# Patient Record
Sex: Male | Born: 1955
Health system: Southern US, Community
[De-identification: ages and names within clinical notes are randomized; demographics above are authoritative.]

## PROBLEM LIST (undated history)

## (undated) DIAGNOSIS — I10 Essential (primary) hypertension: Secondary | ICD-10-CM

## (undated) DIAGNOSIS — Z87442 Personal history of urinary calculi: Secondary | ICD-10-CM

## (undated) DIAGNOSIS — M1990 Unspecified osteoarthritis, unspecified site: Secondary | ICD-10-CM

## (undated) DIAGNOSIS — K219 Gastro-esophageal reflux disease without esophagitis: Secondary | ICD-10-CM

## (undated) DIAGNOSIS — G4700 Insomnia, unspecified: Secondary | ICD-10-CM

## (undated) HISTORY — PX: LASIK: SHX215

## (undated) HISTORY — PX: APPENDECTOMY: SHX54

## (undated) HISTORY — PX: OTHER SURGICAL HISTORY: SHX169

## (undated) HISTORY — PX: EYE SURGERY: SHX253

## (undated) HISTORY — PX: WISDOM TOOTH EXTRACTION: SHX21

---

## 2004-07-10 ENCOUNTER — Emergency Department: Payer: Self-pay | Admitting: Emergency Medicine

## 2004-07-10 ENCOUNTER — Other Ambulatory Visit: Payer: Self-pay

## 2004-07-16 ENCOUNTER — Ambulatory Visit: Payer: Self-pay | Admitting: Unknown Physician Specialty

## 2005-11-14 ENCOUNTER — Ambulatory Visit: Payer: Self-pay | Admitting: Unknown Physician Specialty

## 2007-03-11 ENCOUNTER — Ambulatory Visit: Payer: Self-pay | Admitting: Unknown Physician Specialty

## 2007-03-13 ENCOUNTER — Emergency Department: Payer: Self-pay | Admitting: Emergency Medicine

## 2008-03-22 ENCOUNTER — Ambulatory Visit: Payer: Self-pay | Admitting: Unknown Physician Specialty

## 2009-08-23 ENCOUNTER — Ambulatory Visit: Payer: Self-pay | Admitting: General Practice

## 2011-06-16 ENCOUNTER — Emergency Department: Payer: Self-pay | Admitting: Emergency Medicine

## 2012-05-27 ENCOUNTER — Emergency Department: Payer: Self-pay | Admitting: Emergency Medicine

## 2013-10-08 ENCOUNTER — Ambulatory Visit: Payer: Self-pay | Admitting: Unknown Physician Specialty

## 2013-10-08 HISTORY — PX: COLONOSCOPY: SHX174

## 2014-08-10 ENCOUNTER — Emergency Department: Payer: Self-pay | Admitting: Emergency Medicine

## 2014-08-10 LAB — BASIC METABOLIC PANEL
Anion Gap: 7 (ref 7–16)
BUN: 25 mg/dL — ABNORMAL HIGH (ref 7–18)
CALCIUM: 9 mg/dL (ref 8.5–10.1)
CHLORIDE: 104 mmol/L (ref 98–107)
Co2: 29 mmol/L (ref 21–32)
Creatinine: 1.25 mg/dL (ref 0.60–1.30)
EGFR (African American): >60
EGFR (Non-African Amer.): >60
GLUCOSE: 126 mg/dL — AB (ref 65–99)
OSMOLALITY: 285 (ref 275–301)
Potassium: 3.9 mmol/L (ref 3.5–5.1)
Sodium: 140 mmol/L (ref 136–145)

## 2014-08-10 LAB — CBC
HCT: 44.8 % (ref 40.0–52.0)
HGB: 15 g/dL (ref 13.0–18.0)
MCH: 31.2 pg (ref 26.0–34.0)
MCHC: 33.5 g/dL (ref 32.0–36.0)
MCV: 93 fL (ref 80–100)
PLATELETS: 230 10*3/uL (ref 150–440)
RBC: 4.81 10*6/uL (ref 4.40–5.90)
RDW: 12.5 % (ref 11.5–14.5)
WBC: 6.2 10*3/uL (ref 3.8–10.6)

## 2014-08-10 LAB — URINALYSIS, COMPLETE
BACTERIA: NONE SEEN
BLOOD: NEGATIVE
Bilirubin,UR: NEGATIVE
Glucose,UR: NEGATIVE mg/dL (ref 0–75)
Hyaline Cast: 6
Ketone: NEGATIVE
LEUKOCYTE ESTERASE: NEGATIVE
NITRITE: NEGATIVE
PH: 5 (ref 4.5–8.0)
Protein: NEGATIVE
SPECIFIC GRAVITY: 1.029 (ref 1.003–1.030)
Squamous Epithelial: <1
WBC UR: 1 /HPF (ref 0–5)

## 2015-09-14 DIAGNOSIS — I10 Essential (primary) hypertension: Secondary | ICD-10-CM | POA: Diagnosis not present

## 2015-09-14 DIAGNOSIS — Z125 Encounter for screening for malignant neoplasm of prostate: Secondary | ICD-10-CM | POA: Diagnosis not present

## 2015-09-14 DIAGNOSIS — N200 Calculus of kidney: Secondary | ICD-10-CM | POA: Diagnosis not present

## 2015-10-02 DIAGNOSIS — I10 Essential (primary) hypertension: Secondary | ICD-10-CM | POA: Insufficient documentation

## 2015-10-02 DIAGNOSIS — K219 Gastro-esophageal reflux disease without esophagitis: Secondary | ICD-10-CM | POA: Insufficient documentation

## 2015-10-02 DIAGNOSIS — N200 Calculus of kidney: Secondary | ICD-10-CM | POA: Diagnosis not present

## 2015-10-02 DIAGNOSIS — Z0000 Encounter for general adult medical examination without abnormal findings: Secondary | ICD-10-CM | POA: Diagnosis not present

## 2015-10-17 ENCOUNTER — Ambulatory Visit: Payer: Self-pay | Admitting: Physician Assistant

## 2015-10-19 ENCOUNTER — Ambulatory Visit: Payer: Self-pay

## 2015-10-30 ENCOUNTER — Other Ambulatory Visit: Payer: Self-pay

## 2015-10-30 NOTE — Progress Notes (Unsigned)
Patient in office today for Zoster vac injection only  VIS given and consent authorization given. Zoster shot given left deltoid 0.60ml sq.  Patient waited no adverse reaction

## 2016-01-04 DIAGNOSIS — H524 Presbyopia: Secondary | ICD-10-CM | POA: Diagnosis not present

## 2016-03-26 DIAGNOSIS — K219 Gastro-esophageal reflux disease without esophagitis: Secondary | ICD-10-CM | POA: Diagnosis not present

## 2016-03-26 DIAGNOSIS — N200 Calculus of kidney: Secondary | ICD-10-CM | POA: Diagnosis not present

## 2016-03-26 DIAGNOSIS — I10 Essential (primary) hypertension: Secondary | ICD-10-CM | POA: Diagnosis not present

## 2016-04-09 DIAGNOSIS — Z125 Encounter for screening for malignant neoplasm of prostate: Secondary | ICD-10-CM | POA: Diagnosis not present

## 2016-04-09 DIAGNOSIS — I10 Essential (primary) hypertension: Secondary | ICD-10-CM | POA: Diagnosis not present

## 2016-04-09 DIAGNOSIS — N200 Calculus of kidney: Secondary | ICD-10-CM | POA: Diagnosis not present

## 2016-04-09 DIAGNOSIS — K219 Gastro-esophageal reflux disease without esophagitis: Secondary | ICD-10-CM | POA: Diagnosis not present

## 2016-08-19 DIAGNOSIS — H903 Sensorineural hearing loss, bilateral: Secondary | ICD-10-CM | POA: Diagnosis not present

## 2016-09-13 DIAGNOSIS — F4322 Adjustment disorder with anxiety: Secondary | ICD-10-CM | POA: Diagnosis not present

## 2016-10-01 ENCOUNTER — Ambulatory Visit: Payer: Self-pay | Admitting: Physician Assistant

## 2016-10-01 VITALS — BP 140/90 | HR 67 | Temp 97.7°F

## 2016-10-01 DIAGNOSIS — J0110 Acute frontal sinusitis, unspecified: Secondary | ICD-10-CM

## 2016-10-01 MED ORDER — PREDNISONE 10 MG PO TABS: ORAL_TABLET | ORAL | 0 refills | Status: DC

## 2016-10-01 MED ORDER — AMOXICILLIN-POT CLAVULANATE 875-125 MG PO TABS: 1.0000 | ORAL_TABLET | Freq: Two times a day (BID) | ORAL | 0 refills | Status: DC

## 2016-10-01 NOTE — Progress Notes (Signed)
S:  Head congestion, yellow blood mucus at times, h/a and sinus pressure on the right for 2 weeks.  Continues to use flonase and saline nose spray with minimal relief.  Increased pressure on right frontal sinus last several days.    O:  TMS dull bilat, min fluid.  Nose mild boggy.  Right max and frontal sinus with pressure with percussion.  Neck supple without aden.   Lungs Clear bilat.  Heart RRR.    A: Right frontal and Maxillary sinusitis  P:  Prednisone taper 60 mg and Augmentin 875mg  bid

## 2016-10-07 DIAGNOSIS — Z125 Encounter for screening for malignant neoplasm of prostate: Secondary | ICD-10-CM | POA: Diagnosis not present

## 2016-10-07 DIAGNOSIS — I10 Essential (primary) hypertension: Secondary | ICD-10-CM | POA: Diagnosis not present

## 2016-10-07 DIAGNOSIS — K219 Gastro-esophageal reflux disease without esophagitis: Secondary | ICD-10-CM | POA: Diagnosis not present

## 2016-10-07 DIAGNOSIS — N200 Calculus of kidney: Secondary | ICD-10-CM | POA: Diagnosis not present

## 2016-10-08 DIAGNOSIS — F4322 Adjustment disorder with anxiety: Secondary | ICD-10-CM | POA: Diagnosis not present

## 2016-10-11 DIAGNOSIS — N200 Calculus of kidney: Secondary | ICD-10-CM | POA: Diagnosis not present

## 2016-10-11 DIAGNOSIS — Z0001 Encounter for general adult medical examination with abnormal findings: Secondary | ICD-10-CM | POA: Diagnosis not present

## 2016-10-11 DIAGNOSIS — K219 Gastro-esophageal reflux disease without esophagitis: Secondary | ICD-10-CM | POA: Diagnosis not present

## 2016-10-11 DIAGNOSIS — I10 Essential (primary) hypertension: Secondary | ICD-10-CM | POA: Diagnosis not present

## 2016-10-11 DIAGNOSIS — F5104 Psychophysiologic insomnia: Secondary | ICD-10-CM | POA: Insufficient documentation

## 2016-10-23 ENCOUNTER — Ambulatory Visit: Payer: Self-pay | Admitting: Physician Assistant

## 2016-10-23 VITALS — BP 130/90 | HR 76 | Temp 98.3°F

## 2016-10-23 DIAGNOSIS — J069 Acute upper respiratory infection, unspecified: Secondary | ICD-10-CM

## 2016-10-23 DIAGNOSIS — R059 Cough, unspecified: Secondary | ICD-10-CM

## 2016-10-23 DIAGNOSIS — R05 Cough: Secondary | ICD-10-CM

## 2016-10-23 DIAGNOSIS — N200 Calculus of kidney: Secondary | ICD-10-CM | POA: Insufficient documentation

## 2016-10-23 LAB — POCT INFLUENZA A/B
Influenza A, POC: NEGATIVE
Influenza B, POC: NEGATIVE

## 2016-10-23 MED ORDER — LEVOFLOXACIN 500 MG PO TABS: 500.0000 mg | ORAL_TABLET | Freq: Every day | ORAL | 0 refills | Status: DC

## 2016-10-23 NOTE — Progress Notes (Signed)
S: C/o runny nose and congestion with dry cough for 3-4 days, + fever, chills, denies cp/sob, v/d; mucus was yellow this am , had cold sx about a month ago and was on augmentin  O: PE: vitals wnl, nad,  perrl eomi, normocephalic, tms dull, nasal mucosa red and swollen, throat injected, neck supple no lymph, lungs c t a, cv rrr, neuro intact, flu swab neg  A:  Acute flu like illness   P: drink fluids, continue regular meds , use otc meds of choice, return if not improving in 5 days, return earlier if worsening , levaquin 500mg  qd x 7d, if worsening with fever/chills/bodyaches, use tamiflu

## 2017-01-22 DIAGNOSIS — H524 Presbyopia: Secondary | ICD-10-CM | POA: Diagnosis not present

## 2017-04-04 DIAGNOSIS — I10 Essential (primary) hypertension: Secondary | ICD-10-CM | POA: Diagnosis not present

## 2017-04-15 DIAGNOSIS — K219 Gastro-esophageal reflux disease without esophagitis: Secondary | ICD-10-CM | POA: Diagnosis not present

## 2017-04-15 DIAGNOSIS — I10 Essential (primary) hypertension: Secondary | ICD-10-CM | POA: Diagnosis not present

## 2017-04-15 DIAGNOSIS — F5104 Psychophysiologic insomnia: Secondary | ICD-10-CM | POA: Diagnosis not present

## 2017-04-15 DIAGNOSIS — Z125 Encounter for screening for malignant neoplasm of prostate: Secondary | ICD-10-CM | POA: Diagnosis not present

## 2017-10-07 DIAGNOSIS — Z125 Encounter for screening for malignant neoplasm of prostate: Secondary | ICD-10-CM | POA: Diagnosis not present

## 2017-10-07 DIAGNOSIS — F5104 Psychophysiologic insomnia: Secondary | ICD-10-CM | POA: Diagnosis not present

## 2017-10-07 DIAGNOSIS — K219 Gastro-esophageal reflux disease without esophagitis: Secondary | ICD-10-CM | POA: Diagnosis not present

## 2017-10-07 DIAGNOSIS — I10 Essential (primary) hypertension: Secondary | ICD-10-CM | POA: Diagnosis not present

## 2017-10-14 DIAGNOSIS — F5104 Psychophysiologic insomnia: Secondary | ICD-10-CM | POA: Diagnosis not present

## 2017-10-14 DIAGNOSIS — Z0000 Encounter for general adult medical examination without abnormal findings: Secondary | ICD-10-CM | POA: Diagnosis not present

## 2017-10-14 DIAGNOSIS — I10 Essential (primary) hypertension: Secondary | ICD-10-CM | POA: Diagnosis not present

## 2017-10-14 DIAGNOSIS — K219 Gastro-esophageal reflux disease without esophagitis: Secondary | ICD-10-CM | POA: Diagnosis not present

## 2017-11-10 DIAGNOSIS — R972 Elevated prostate specific antigen [PSA]: Secondary | ICD-10-CM | POA: Diagnosis not present

## 2017-11-10 DIAGNOSIS — K219 Gastro-esophageal reflux disease without esophagitis: Secondary | ICD-10-CM | POA: Diagnosis not present

## 2018-04-07 DIAGNOSIS — K219 Gastro-esophageal reflux disease without esophagitis: Secondary | ICD-10-CM | POA: Diagnosis not present

## 2018-04-07 DIAGNOSIS — I10 Essential (primary) hypertension: Secondary | ICD-10-CM | POA: Diagnosis not present

## 2018-04-07 DIAGNOSIS — F5104 Psychophysiologic insomnia: Secondary | ICD-10-CM | POA: Diagnosis not present

## 2018-04-13 DIAGNOSIS — H524 Presbyopia: Secondary | ICD-10-CM | POA: Diagnosis not present

## 2018-04-13 DIAGNOSIS — Z125 Encounter for screening for malignant neoplasm of prostate: Secondary | ICD-10-CM | POA: Diagnosis not present

## 2018-04-13 DIAGNOSIS — F5104 Psychophysiologic insomnia: Secondary | ICD-10-CM | POA: Diagnosis not present

## 2018-04-13 DIAGNOSIS — I10 Essential (primary) hypertension: Secondary | ICD-10-CM | POA: Diagnosis not present

## 2018-04-13 DIAGNOSIS — K219 Gastro-esophageal reflux disease without esophagitis: Secondary | ICD-10-CM | POA: Diagnosis not present

## 2018-10-07 DIAGNOSIS — K219 Gastro-esophageal reflux disease without esophagitis: Secondary | ICD-10-CM | POA: Diagnosis not present

## 2018-10-07 DIAGNOSIS — I10 Essential (primary) hypertension: Secondary | ICD-10-CM | POA: Diagnosis not present

## 2018-10-07 DIAGNOSIS — Z125 Encounter for screening for malignant neoplasm of prostate: Secondary | ICD-10-CM | POA: Diagnosis not present

## 2018-10-15 DIAGNOSIS — K219 Gastro-esophageal reflux disease without esophagitis: Secondary | ICD-10-CM | POA: Diagnosis not present

## 2018-10-15 DIAGNOSIS — I10 Essential (primary) hypertension: Secondary | ICD-10-CM | POA: Diagnosis not present

## 2018-10-15 DIAGNOSIS — Z0000 Encounter for general adult medical examination without abnormal findings: Secondary | ICD-10-CM | POA: Diagnosis not present

## 2018-10-15 DIAGNOSIS — F5104 Psychophysiologic insomnia: Secondary | ICD-10-CM | POA: Diagnosis not present

## 2019-02-15 ENCOUNTER — Inpatient Hospital Stay: Admission: RE | Admit: 2019-02-15 | Payer: 59 | Source: Ambulatory Visit

## 2019-03-02 ENCOUNTER — Other Ambulatory Visit: Admission: RE | Admit: 2019-03-02 | Payer: 59 | Source: Ambulatory Visit

## 2019-03-05 ENCOUNTER — Ambulatory Visit: Admit: 2019-03-05 | Payer: 59 | Admitting: Unknown Physician Specialty

## 2019-03-05 SURGERY — COLONOSCOPY WITH PROPOFOL
Anesthesia: General

## 2019-04-01 ENCOUNTER — Other Ambulatory Visit: Admission: RE | Admit: 2019-04-01 | Payer: Self-pay | Source: Ambulatory Visit

## 2019-04-05 ENCOUNTER — Encounter
Admission: RE | Admit: 2019-04-05 | Discharge: 2019-04-05 | Disposition: A | Discharge status: Home / Self Care | Payer: PPO | Source: Ambulatory Visit | Attending: Unknown Physician Specialty | Admitting: Unknown Physician Specialty

## 2019-04-05 ENCOUNTER — Other Ambulatory Visit: Payer: Self-pay

## 2019-04-05 DIAGNOSIS — Z79899 Other long term (current) drug therapy: Secondary | ICD-10-CM | POA: Diagnosis not present

## 2019-04-05 DIAGNOSIS — G4700 Insomnia, unspecified: Secondary | ICD-10-CM | POA: Diagnosis not present

## 2019-04-05 DIAGNOSIS — I10 Essential (primary) hypertension: Secondary | ICD-10-CM | POA: Diagnosis not present

## 2019-04-05 DIAGNOSIS — Z8719 Personal history of other diseases of the digestive system: Secondary | ICD-10-CM | POA: Diagnosis not present

## 2019-04-05 DIAGNOSIS — K641 Second degree hemorrhoids: Secondary | ICD-10-CM | POA: Diagnosis not present

## 2019-04-05 DIAGNOSIS — Z1211 Encounter for screening for malignant neoplasm of colon: Secondary | ICD-10-CM | POA: Diagnosis not present

## 2019-04-05 DIAGNOSIS — Z1159 Encounter for screening for other viral diseases: Secondary | ICD-10-CM | POA: Insufficient documentation

## 2019-04-05 DIAGNOSIS — Z20828 Contact with and (suspected) exposure to other viral communicable diseases: Secondary | ICD-10-CM | POA: Diagnosis not present

## 2019-04-05 LAB — SARS CORONAVIRUS 2 (TAT 6-24 HRS): SARS Coronavirus 2: NEGATIVE

## 2019-04-06 ENCOUNTER — Encounter: Payer: Self-pay | Admitting: *Deleted

## 2019-04-07 ENCOUNTER — Ambulatory Visit
Admission: RE | Admit: 2019-04-07 | Discharge: 2019-04-07 | Disposition: A | Discharge status: Home / Self Care | Payer: PPO | Attending: Internal Medicine | Admitting: Internal Medicine

## 2019-04-07 ENCOUNTER — Encounter: Payer: Self-pay | Admitting: *Deleted

## 2019-04-07 ENCOUNTER — Ambulatory Visit: Payer: PPO | Admitting: Anesthesiology

## 2019-04-07 ENCOUNTER — Encounter
Admission: RE | Disposition: A | Discharge status: Home / Self Care | Payer: Self-pay | Source: Home / Self Care | Attending: Internal Medicine

## 2019-04-07 DIAGNOSIS — K644 Residual hemorrhoidal skin tags: Secondary | ICD-10-CM | POA: Diagnosis not present

## 2019-04-07 DIAGNOSIS — K640 First degree hemorrhoids: Secondary | ICD-10-CM | POA: Diagnosis not present

## 2019-04-07 DIAGNOSIS — G4700 Insomnia, unspecified: Secondary | ICD-10-CM | POA: Insufficient documentation

## 2019-04-07 DIAGNOSIS — Z79899 Other long term (current) drug therapy: Secondary | ICD-10-CM | POA: Insufficient documentation

## 2019-04-07 DIAGNOSIS — Z8719 Personal history of other diseases of the digestive system: Secondary | ICD-10-CM | POA: Insufficient documentation

## 2019-04-07 DIAGNOSIS — Z20828 Contact with and (suspected) exposure to other viral communicable diseases: Secondary | ICD-10-CM | POA: Insufficient documentation

## 2019-04-07 DIAGNOSIS — Z1211 Encounter for screening for malignant neoplasm of colon: Secondary | ICD-10-CM | POA: Diagnosis not present

## 2019-04-07 DIAGNOSIS — I10 Essential (primary) hypertension: Secondary | ICD-10-CM | POA: Insufficient documentation

## 2019-04-07 DIAGNOSIS — K641 Second degree hemorrhoids: Secondary | ICD-10-CM | POA: Insufficient documentation

## 2019-04-07 DIAGNOSIS — Z86010 Personal history of colonic polyps: Secondary | ICD-10-CM | POA: Diagnosis not present

## 2019-04-07 HISTORY — PX: COLONOSCOPY WITH PROPOFOL: SHX5780

## 2019-04-07 HISTORY — DX: Insomnia, unspecified: G47.00

## 2019-04-07 HISTORY — DX: Personal history of urinary calculi: Z87.442

## 2019-04-07 HISTORY — DX: Gastro-esophageal reflux disease without esophagitis: K21.9

## 2019-04-07 HISTORY — DX: Essential (primary) hypertension: I10

## 2019-04-07 SURGERY — COLONOSCOPY WITH PROPOFOL
Anesthesia: General

## 2019-04-07 MED ORDER — PROPOFOL 500 MG/50ML IV EMUL
INTRAVENOUS | Status: AC
  Filled 2019-04-07: qty 50

## 2019-04-07 MED ORDER — LIDOCAINE HCL (PF) 2 % IJ SOLN
INTRAMUSCULAR | Status: AC
  Filled 2019-04-07: qty 10

## 2019-04-07 MED ORDER — LIDOCAINE HCL (CARDIAC) PF 100 MG/5ML IV SOSY
PREFILLED_SYRINGE | INTRAVENOUS | Status: DC | PRN
  Administered 2019-04-07: 100 mg via INTRAVENOUS

## 2019-04-07 MED ORDER — PROPOFOL 10 MG/ML IV BOLUS
INTRAVENOUS | Status: DC | PRN
  Administered 2019-04-07: 30 mg via INTRAVENOUS
  Administered 2019-04-07: 50 mg via INTRAVENOUS
  Administered 2019-04-07: 30 mg via INTRAVENOUS

## 2019-04-07 MED ORDER — PROPOFOL 500 MG/50ML IV EMUL
INTRAVENOUS | Status: DC | PRN
  Administered 2019-04-07: 100 ug/kg/min via INTRAVENOUS

## 2019-04-07 MED ORDER — SODIUM CHLORIDE 0.9 % IV SOLN
INTRAVENOUS | Status: DC
  Administered 2019-04-07: 10:00:00 via INTRAVENOUS

## 2019-04-07 NOTE — Anesthesia Post-op Follow-up Note (Signed)
Anesthesia QCDR form completed.        

## 2019-04-07 NOTE — Anesthesia Preprocedure Evaluation (Signed)
Anesthesia Evaluation  Patient identified by MRN, date of birth, ID band Patient awake    Reviewed: Allergy & Precautions, H&P , NPO status , Patient's Chart, lab work & pertinent test results, reviewed documented beta blocker date and time   History of Anesthesia Complications Negative for: history of anesthetic complications  Airway Mallampati: II  TM Distance: >3 FB Neck ROM: full    Dental  (+) Implants, Dental Advidsory Given, Teeth Intact   Pulmonary neg pulmonary ROS,    Pulmonary exam normal        Cardiovascular Exercise Tolerance: Good hypertension, (-) angina(-) Past MI and (-) Cardiac Stents Normal cardiovascular exam(-) dysrhythmias (-) Valvular Problems/Murmurs     Neuro/Psych negative neurological ROS  negative psych ROS   GI/Hepatic Neg liver ROS, GERD  ,  Endo/Other  negative endocrine ROS  Renal/GU Renal disease (stones)  negative genitourinary   Musculoskeletal   Abdominal   Peds  Hematology negative hematology ROS (+)   Anesthesia Other Findings Past Medical History: No date: GERD (gastroesophageal reflux disease) No date: History of kidney stones No date: Hypertension No date: Insomnia disorder   Reproductive/Obstetrics negative OB ROS                             Anesthesia Physical Anesthesia Plan  ASA: II  Anesthesia Plan: General   Post-op Pain Management:    Induction: Intravenous  PONV Risk Score and Plan: 2 and Propofol infusion and TIVA  Airway Management Planned: Natural Airway and Nasal Cannula  Additional Equipment:   Intra-op Plan:   Post-operative Plan:   Informed Consent: I have reviewed the patients History and Physical, chart, labs and discussed the procedure including the risks, benefits and alternatives for the proposed anesthesia with the patient or authorized representative who has indicated his/her understanding and acceptance.      Dental Advisory Given  Plan Discussed with: Anesthesiologist, CRNA and Surgeon  Anesthesia Plan Comments:         Anesthesia Quick Evaluation

## 2019-04-07 NOTE — Transfer of Care (Signed)
Immediate Anesthesia Transfer of Care Note  Patient: Lee Gonzales  Procedure(s) Performed: COLONOSCOPY WITH PROPOFOL (N/A )  Patient Location: PACU  Anesthesia Type:General  Level of Consciousness: oriented  Airway & Oxygen Therapy: Patient Spontanous Breathing and Patient connected to nasal cannula oxygen  Post-op Assessment: Report given to RN and Post -op Vital signs reviewed and stable  Post vital signs: Reviewed and stable  Last Vitals:  Vitals Value Taken Time  BP    Temp    Pulse    Resp    SpO2      Last Pain:  Vitals:   04/07/19 1004  TempSrc: Tympanic  PainSc: 0-No pain         Complications: No apparent anesthesia complications

## 2019-04-07 NOTE — H&P (Signed)
Outpatient short stay form Pre-procedure 04/07/2019 8:56 AM Lee Gonzales K. Alice Reichert, M.D.  Primary Physician: Ramonita Lab, M.D.  Reason for visit:  Personal hx of adenomatous colon polyps  History of present illness:                            Patient presents for colonoscopy for a personal hx of colon polyps. The patient denies abdominal pain, abnormal weight loss or rectal bleeding.    No current facility-administered medications for this encounter.   Current Outpatient Medications:  .  fluticasone (FLONASE) 50 MCG/ACT nasal spray, , Disp: , Rfl: 12 .  levofloxacin (LEVAQUIN) 500 MG tablet, Take 1 tablet (500 mg total) by mouth daily., Disp: 7 tablet, Rfl: 0 .  lisinopril (PRINIVIL,ZESTRIL) 40 MG tablet, TAKE ONE TABLET BY MOUTH DAILY, Disp: , Rfl:  .  zolpidem (AMBIEN CR) 6.25 MG CR tablet, TAKE ONE TABLET BY MOUTH AT BEDTIME AS NEEDED FOR SLEEP, Disp: , Rfl:   No medications prior to admission.     No Known Allergies   Past Medical History:  Diagnosis Date  . GERD (gastroesophageal reflux disease)   . History of kidney stones   . Hypertension   . Insomnia disorder     Review of systems:  Otherwise negative.    Physical Exam  Gen: Alert, oriented. Appears stated age.  HEENT: [place]/AT. PERRLA. Lungs: CTA, no wheezes. CV: RR nl S1, S2. Abd: soft, benign, no masses. BS+ Ext: No edema. Pulses 2+    Planned procedures: Proceed with colonoscopy. The patient understands the nature of the planned procedure, indications, risks, alternatives and potential complications including but not limited to bleeding, infection, perforation, damage to internal organs and possible oversedation/side effects from anesthesia. The patient agrees and gives consent to proceed.  Please refer to procedure notes for findings, recommendations and patient disposition/instructions.     Lee Gonzales K. Alice Reichert, M.D. Gastroenterology 04/07/2019  8:56 AM

## 2019-04-07 NOTE — Op Note (Signed)
[facility] Gastroenterology Patient Name: Lee Gonzales Procedure Date: 04/07/2019 10:34 AM MRN: [id number] Account #: [id number] Date of Birth: 09/06/1956 Admit Type: Outpatient Age: [age] [facility] Gender: Male Note Status: Finalized Procedure:            Colonoscopy Indications:          High risk colon cancer surveillance: Personal history                        of colonic polyps Providers:            Boykin Nearingeodoro K. Thaer Miyoshi MD, MD Medicines:            Propofol per Anesthesia Complications:        No immediate complications. Procedure:            Pre-Anesthesia Assessment:                       - The risks and benefits of the procedure and the                        sedation options and risks were discussed with the                        patient. All questions were answered and informed                        consent was obtained.                       - Patient identification and proposed procedure were                        verified prior to the procedure by the nurse. The                        procedure was verified in the procedure room.                       - ASA Grade Assessment: III - A patient with severe                        systemic disease.                       - After reviewing the risks and benefits, the patient                        was deemed in satisfactory condition to undergo the                        procedure.                       After obtaining informed consent, the colonoscope was                        passed under direct vision. Throughout the procedure,                        the patient's blood pressure, pulse, and oxygen  saturations were monitored continuously. The                        Colonoscope was introduced through the anus and                        advanced to the the cecum, identified by appendiceal                        orifice and ileocecal valve. The colonoscopy was          performed without difficulty. The patient tolerated the                        procedure well. The quality of the bowel preparation                        was adequate. The ileocecal valve, appendiceal orifice,                        and rectum were photographed. Findings:      The perianal exam findings include internal hemorrhoids that prolapse       with straining, but spontaneously regress to the resting position (Grade       II).      The digital rectal exam was normal. Pertinent negatives include normal       sphincter tone.      The colon (entire examined portion) appeared normal.      Non-bleeding internal hemorrhoids were found during retroflexion. The       hemorrhoids were Grade I (internal hemorrhoids that do not prolapse).      The exam was otherwise without abnormality. Impression:           - Internal hemorrhoids that prolapse with straining,                        but spontaneously regress to the resting position                        (Grade II) found on perianal exam.                       - The entire examined colon is normal.                       - Non-bleeding internal hemorrhoids.                       - The examination was otherwise normal.                       - No specimens collected. Recommendation:       - Patient has a contact number available for                        emergencies. The signs and symptoms of potential                        delayed complications were discussed with the patient.                        Return to normal  activities tomorrow. Written discharge                        instructions were provided to the patient.                       - Resume previous diet.                       - Continue present medications.                       - Repeat colonoscopy in 5 years for surveillance.                       - The findings and recommendations were discussed with                        the patient. Procedure Code(s):    ---  Professional ---                       [id number], Colorectal cancer screening; colonoscopy on                        individual at high risk Diagnosis Code(s):    --- Professional ---                       K64.1, Second degree hemorrhoids                       Z86.010, Personal history of colonic polyps CPT copyright 2019 [employer]. All rights reserved. The codes documented in this report are preliminary and upon coder review may  be revised to meet current compliance requirements. Stanton Kidneyeodoro K Mahir Prabhakar MD, MD 04/07/2019 11:05:38 AM This report has been signed electronically. Number of Addenda: 0 Note Initiated On: 04/07/2019 10:34 AM Scope Withdrawal Time: 0 hours 9 minutes 52 seconds  Total Procedure Duration: 0 hours 14 minutes 14 seconds  Estimated Blood Loss: Estimated blood loss: none.      [facility]

## 2019-04-07 NOTE — Anesthesia Postprocedure Evaluation (Signed)
Anesthesia Post Note  Patient: [place]  Procedure(s) Performed: COLONOSCOPY WITH PROPOFOL (N/A )  Patient location during evaluation: Endoscopy Anesthesia Type: General Level of consciousness: awake and alert Pain management: pain level controlled Vital Signs Assessment: post-procedure vital signs reviewed and stable Respiratory status: spontaneous breathing, nonlabored ventilation, respiratory function stable and patient connected to nasal cannula oxygen Cardiovascular status: blood pressure returned to baseline and stable Postop Assessment: no apparent nausea or vomiting Anesthetic complications: no     Last Vitals:  Vitals:   04/07/19 1127 04/07/19 1128  BP:    Pulse: 66 65  Resp: 13 18  Temp:    SpO2: 100% 100%    Last Pain:  Vitals:   04/07/19 1128  TempSrc:   PainSc: 0-No pain                 Martha Clan

## 2019-04-08 ENCOUNTER — Encounter: Payer: Self-pay | Admitting: Internal Medicine

## 2019-04-15 ENCOUNTER — Other Ambulatory Visit: Payer: Self-pay

## 2020-11-10 ENCOUNTER — Other Ambulatory Visit (HOSPITAL_COMMUNITY): Payer: Self-pay | Admitting: Sports Medicine

## 2020-11-10 ENCOUNTER — Other Ambulatory Visit: Payer: Self-pay | Admitting: Sports Medicine

## 2020-11-10 DIAGNOSIS — M76891 Other specified enthesopathies of right lower limb, excluding foot: Secondary | ICD-10-CM

## 2020-11-10 DIAGNOSIS — M25561 Pain in right knee: Secondary | ICD-10-CM

## 2020-11-10 DIAGNOSIS — G8929 Other chronic pain: Secondary | ICD-10-CM

## 2020-11-27 ENCOUNTER — Ambulatory Visit
Admission: RE | Admit: 2020-11-27 | Discharge: 2020-11-27 | Disposition: A | Discharge status: Home / Self Care | Payer: Medicare Other | Source: Ambulatory Visit | Attending: Sports Medicine | Admitting: Sports Medicine

## 2020-11-27 ENCOUNTER — Other Ambulatory Visit: Payer: Self-pay

## 2020-11-27 DIAGNOSIS — M76891 Other specified enthesopathies of right lower limb, excluding foot: Secondary | ICD-10-CM | POA: Diagnosis present

## 2020-11-27 DIAGNOSIS — G8929 Other chronic pain: Secondary | ICD-10-CM | POA: Insufficient documentation

## 2020-11-27 DIAGNOSIS — M25561 Pain in right knee: Secondary | ICD-10-CM | POA: Insufficient documentation

## 2021-08-17 ENCOUNTER — Other Ambulatory Visit: Payer: Self-pay | Admitting: Surgery

## 2021-08-29 ENCOUNTER — Other Ambulatory Visit
Admission: RE | Admit: 2021-08-29 | Discharge: 2021-08-29 | Disposition: A | Discharge status: Home / Self Care | Payer: Medicare Other | Source: Ambulatory Visit | Attending: Surgery | Admitting: Surgery

## 2021-08-29 ENCOUNTER — Other Ambulatory Visit: Payer: Self-pay

## 2021-08-29 DIAGNOSIS — I10 Essential (primary) hypertension: Secondary | ICD-10-CM

## 2021-08-29 HISTORY — DX: Unspecified osteoarthritis, unspecified site: M19.90

## 2021-08-29 NOTE — Patient Instructions (Addendum)
Your procedure is scheduled on: 09/06/21 - Thursday Report to the Registration Desk on the 1st floor of the [facility]. To find out your arrival time, please call [phone] between 1PM - 3PM on: 09/05/21 - Wednesday  REMEMBER: Instructions that are not followed completely may result in serious medical risk, up to and including death; or upon the discretion of your surgeon and anesthesiologist your surgery may need to be rescheduled.  Do not eat food after midnight the night before surgery.  No gum chewing, lozengers or hard candies.  You may however, drink CLEAR liquids up to 2 hours before you are scheduled to arrive for your surgery. Do not drink anything within 2 hours of your scheduled arrival time.  Clear liquids include: - water  - apple juice without pulp - gatorade (not RED, PURPLE, OR BLUE) - black coffee or tea (Do NOT add milk or creamers to the coffee or tea) Do NOT drink anything that is not on this list.   TAKE THESE MEDICATIONS THE MORNING OF SURGERY WITH A SIP OF WATER: NONE   One week prior to surgery: Stop Anti-inflammatories (NSAIDS) such as Advil, Aleve, Ibuprofen, Motrin, Naproxen, Naprosyn and Aspirin based products such as Excedrin, Goodys Powder, BC Powder.  Stop ANY OVER THE COUNTER supplements until after surgery.  You may however, continue to take Tylenol if needed for pain up until the day of surgery.  No Alcohol for 24 hours before or after surgery.  No Smoking including e-cigarettes for 24 hours prior to surgery.  No chewable tobacco products for at least 6 hours prior to surgery.  No nicotine patches on the day of surgery.  Do not use any "recreational" drugs for at least a week prior to your surgery.  Please be advised that the combination of cocaine and anesthesia may have negative outcomes, up to and including death. If you test positive for cocaine, your surgery will be cancelled.  On the morning of surgery brush your teeth with  toothpaste and water, you may rinse your mouth with mouthwash if you wish. Do not swallow any toothpaste or mouthwash.  Use CHG Soap or wipes as directed on instruction sheet.  Do not wear jewelry, make-up, hairpins, clips or nail polish.  Do not wear lotions, powders, or perfumes. You may apply deodorant.  Do not shave body from the neck down 48 hours prior to surgery just in case you cut yourself which could leave a site for infection.  Also, freshly shaved skin may become irritated if using the CHG soap.  Contact lenses, hearing aids and dentures may not be worn into surgery.  Do not bring valuables to the hospital. [facility] is not responsible for any missing/lost belongings or valuables.   Notify your doctor if there is any change in your medical condition (cold, fever, infection).  Wear comfortable clothing (specific to your surgery type) to the hospital.  After surgery, you can help prevent lung complications by doing breathing exercises.  Take deep breaths and cough every 1-2 hours. Your doctor may order a device called an Incentive Spirometer to help you take deep breaths. When coughing or sneezing, hold a pillow firmly against your incision with both hands. This is called splinting. Doing this helps protect your incision. It also decreases belly discomfort.  If you are being admitted to the hospital overnight, leave your suitcase in the car. After surgery it may be brought to your room.  If you are being discharged the day of surgery,  you will not be allowed to drive home. You will need a responsible adult (18 years or older) to drive you home and stay with you that night.   If you are taking public transportation, you will need to have a responsible adult (18 years or older) with you. Please confirm with your physician that it is acceptable to use public transportation.   Please call the Pre-admissions Testing Dept. at [phone] if you have any questions about  these instructions.  Surgery Visitation Policy:  Patients undergoing a surgery or procedure may have one family member or support person with them as long as that person is not COVID-19 positive or experiencing its symptoms.  That person may remain in the waiting area during the procedure and may rotate out with other people.  Inpatient Visitation:    Visiting hours are 7 a.m. to 8 p.m. Up to two visitors ages 16+ are allowed at one time in a patient room. The visitors may rotate out with other people during the day. Visitors must check out when they leave, or other visitors will not be allowed. One designated support person may remain overnight. The visitor must pass COVID-19 screenings, use hand sanitizer when entering and exiting the patients room and wear a mask at all times, including in the patients room. Patients must also wear a mask when staff or their visitor are in the room. Masking is required regardless of vaccination status.

## 2021-09-06 ENCOUNTER — Ambulatory Visit: Payer: Medicare Other | Admitting: Anesthesiology

## 2021-09-06 ENCOUNTER — Ambulatory Visit
Admission: RE | Admit: 2021-09-06 | Discharge: 2021-09-06 | Disposition: A | Discharge status: Home / Self Care | Payer: Medicare Other | Attending: Surgery | Admitting: Surgery

## 2021-09-06 ENCOUNTER — Encounter
Admission: RE | Disposition: A | Discharge status: Home / Self Care | Payer: Self-pay | Source: Home / Self Care | Attending: Surgery

## 2021-09-06 ENCOUNTER — Other Ambulatory Visit: Payer: Self-pay

## 2021-09-06 ENCOUNTER — Ambulatory Visit: Payer: Medicare Other | Admitting: Urgent Care

## 2021-09-06 ENCOUNTER — Encounter: Payer: Self-pay | Admitting: Surgery

## 2021-09-06 DIAGNOSIS — M1711 Unilateral primary osteoarthritis, right knee: Secondary | ICD-10-CM | POA: Diagnosis present

## 2021-09-06 DIAGNOSIS — I10 Essential (primary) hypertension: Secondary | ICD-10-CM | POA: Insufficient documentation

## 2021-09-06 DIAGNOSIS — K219 Gastro-esophageal reflux disease without esophagitis: Secondary | ICD-10-CM | POA: Insufficient documentation

## 2021-09-06 HISTORY — PX: KNEE ARTHROSCOPY: SHX127

## 2021-09-06 LAB — CBC
HCT: 44.3 % (ref 39.0–52.0)
Hemoglobin: 15.1 g/dL (ref 13.0–17.0)
MCH: 31.3 pg (ref 26.0–34.0)
MCHC: 34.1 g/dL (ref 30.0–36.0)
MCV: 91.7 fL (ref 80.0–100.0)
Platelets: 222 10*3/uL (ref 150–400)
RBC: 4.83 MIL/uL (ref 4.22–5.81)
RDW: 11.8 % (ref 11.5–15.5)
WBC: 5 10*3/uL (ref 4.0–10.5)
nRBC: 0 % (ref 0.0–0.2)

## 2021-09-06 LAB — BASIC METABOLIC PANEL
Anion gap: 6 (ref 5–15)
BUN: 17 mg/dL (ref 8–23)
CO2: 28 mmol/L (ref 22–32)
Calcium: 9 mg/dL (ref 8.9–10.3)
Chloride: 103 mmol/L (ref 98–111)
Creatinine, Ser: 0.99 mg/dL (ref 0.61–1.24)
GFR, Estimated: >60 mL/min (ref >60)
Glucose, Bld: 100 mg/dL — ABNORMAL HIGH (ref 70–99)
Potassium: 4 mmol/L (ref 3.5–5.1)
Sodium: 137 mmol/L (ref 135–145)

## 2021-09-06 SURGERY — ARTHROSCOPY, KNEE
Anesthesia: General | Site: Knee | Laterality: Right

## 2021-09-06 MED ORDER — METOCLOPRAMIDE HCL 10 MG PO TABS: 5.0000 mg | ORAL_TABLET | Freq: Three times a day (TID) | ORAL | Status: DC | PRN

## 2021-09-06 MED ORDER — HYDROCODONE-ACETAMINOPHEN 5-325 MG PO TABS: 1.0000 | ORAL_TABLET | Freq: Four times a day (QID) | ORAL | 0 refills | Status: AC | PRN

## 2021-09-06 MED ORDER — LIDOCAINE HCL (PF) 1 % IJ SOLN
INTRAMUSCULAR | Status: AC
  Filled 2021-09-06: qty 30

## 2021-09-06 MED ORDER — ONDANSETRON HCL 4 MG/2ML IJ SOLN: 4.0000 mg | Freq: Four times a day (QID) | INTRAMUSCULAR | Status: DC | PRN

## 2021-09-06 MED ORDER — ONDANSETRON HCL 4 MG/2ML IJ SOLN
INTRAMUSCULAR | Status: AC
  Filled 2021-09-06: qty 2

## 2021-09-06 MED ORDER — ONDANSETRON HCL 4 MG PO TABS: 4.0000 mg | ORAL_TABLET | Freq: Four times a day (QID) | ORAL | Status: DC | PRN

## 2021-09-06 MED ORDER — LIDOCAINE HCL (CARDIAC) PF 100 MG/5ML IV SOSY
PREFILLED_SYRINGE | INTRAVENOUS | Status: DC | PRN
  Administered 2021-09-06: 100 mg via INTRAVENOUS

## 2021-09-06 MED ORDER — DEXAMETHASONE SODIUM PHOSPHATE 10 MG/ML IJ SOLN
INTRAMUSCULAR | Status: DC | PRN
  Administered 2021-09-06: 10 mg via INTRAVENOUS

## 2021-09-06 MED ORDER — RINGERS IRRIGATION IR SOLN
Status: DC | PRN
  Administered 2021-09-06: 3000 mL

## 2021-09-06 MED ORDER — BUPIVACAINE-EPINEPHRINE (PF) 0.5% -1:200000 IJ SOLN
INTRAMUSCULAR | Status: DC | PRN
  Administered 2021-09-06: 20 mL

## 2021-09-06 MED ORDER — BUPIVACAINE-EPINEPHRINE (PF) 0.5% -1:200000 IJ SOLN
INTRAMUSCULAR | Status: AC
  Filled 2021-09-06: qty 30

## 2021-09-06 MED ORDER — FENTANYL CITRATE (PF) 100 MCG/2ML IJ SOLN: 25.0000 ug | INTRAMUSCULAR | Status: DC | PRN

## 2021-09-06 MED ORDER — SODIUM CHLORIDE 0.9 % IV SOLN: INTRAVENOUS | Status: DC

## 2021-09-06 MED ORDER — PROPOFOL 10 MG/ML IV BOLUS
INTRAVENOUS | Status: DC | PRN
  Administered 2021-09-06: 200 mg via INTRAVENOUS

## 2021-09-06 MED ORDER — PROPOFOL 10 MG/ML IV BOLUS
INTRAVENOUS | Status: AC
  Filled 2021-09-06: qty 20

## 2021-09-06 MED ORDER — FENTANYL CITRATE (PF) 100 MCG/2ML IJ SOLN
INTRAMUSCULAR | Status: AC
  Filled 2021-09-06: qty 2

## 2021-09-06 MED ORDER — EPHEDRINE SULFATE 50 MG/ML IJ SOLN
INTRAMUSCULAR | Status: DC | PRN
Start: 2021-09-06 — End: 2021-09-06
  Administered 2021-09-06: 10 mg via INTRAVENOUS

## 2021-09-06 MED ORDER — HYDROCODONE-ACETAMINOPHEN 5-325 MG PO TABS: 1.0000 | ORAL_TABLET | ORAL | Status: DC | PRN

## 2021-09-06 MED ORDER — KETOROLAC TROMETHAMINE 15 MG/ML IJ SOLN: 15.0000 mg | Freq: Once | INTRAMUSCULAR | Status: DC

## 2021-09-06 MED ORDER — FAMOTIDINE 20 MG PO TABS
ORAL_TABLET | ORAL | Status: AC
  Filled 2021-09-06: qty 1

## 2021-09-06 MED ORDER — ORAL CARE MOUTH RINSE: 15.0000 mL | Freq: Once | OROMUCOSAL | Status: AC

## 2021-09-06 MED ORDER — KETOROLAC TROMETHAMINE 30 MG/ML IJ SOLN
INTRAMUSCULAR | Status: DC | PRN
  Administered 2021-09-06: 15 mg via INTRAVENOUS

## 2021-09-06 MED ORDER — CEFAZOLIN SODIUM-DEXTROSE 2-4 GM/100ML-% IV SOLN
2.0000 g | INTRAVENOUS | Status: AC
  Administered 2021-09-06: 15:00:00 2 g via INTRAVENOUS

## 2021-09-06 MED ORDER — MIDAZOLAM HCL 2 MG/2ML IJ SOLN
INTRAMUSCULAR | Status: AC
  Filled 2021-09-06: qty 2

## 2021-09-06 MED ORDER — LIDOCAINE HCL (PF) 1 % IJ SOLN
INTRAMUSCULAR | Status: DC | PRN
  Administered 2021-09-06: 60 mL via INTRAMUSCULAR

## 2021-09-06 MED ORDER — LIDOCAINE HCL (PF) 2 % IJ SOLN
INTRAMUSCULAR | Status: AC
  Filled 2021-09-06: qty 5

## 2021-09-06 MED ORDER — METOCLOPRAMIDE HCL 5 MG/ML IJ SOLN: 5.0000 mg | Freq: Three times a day (TID) | INTRAMUSCULAR | Status: DC | PRN

## 2021-09-06 MED ORDER — LACTATED RINGERS IV SOLN: INTRAVENOUS | Status: DC

## 2021-09-06 MED ORDER — FAMOTIDINE 20 MG PO TABS
20.0000 mg | ORAL_TABLET | Freq: Once | ORAL | Status: AC
  Administered 2021-09-06: 13:00:00 20 mg via ORAL

## 2021-09-06 MED ORDER — FENTANYL CITRATE (PF) 100 MCG/2ML IJ SOLN
INTRAMUSCULAR | Status: DC | PRN
  Administered 2021-09-06: 50 ug via INTRAVENOUS

## 2021-09-06 MED ORDER — MIDAZOLAM HCL 2 MG/2ML IJ SOLN
INTRAMUSCULAR | Status: DC | PRN
  Administered 2021-09-06: 2 mg via INTRAVENOUS

## 2021-09-06 MED ORDER — DEXAMETHASONE SODIUM PHOSPHATE 10 MG/ML IJ SOLN
INTRAMUSCULAR | Status: AC
  Filled 2021-09-06: qty 1

## 2021-09-06 MED ORDER — ONDANSETRON HCL 4 MG/2ML IJ SOLN: 4.0000 mg | Freq: Once | INTRAMUSCULAR | Status: DC | PRN

## 2021-09-06 MED ORDER — ONDANSETRON HCL 4 MG/2ML IJ SOLN
INTRAMUSCULAR | Status: DC | PRN
  Administered 2021-09-06: 4 mg via INTRAVENOUS

## 2021-09-06 MED ORDER — MEPERIDINE HCL 25 MG/ML IJ SOLN: 6.2500 mg | INTRAMUSCULAR | Status: DC | PRN

## 2021-09-06 MED ORDER — CEFAZOLIN SODIUM-DEXTROSE 2-4 GM/100ML-% IV SOLN
INTRAVENOUS | Status: AC
  Filled 2021-09-06: qty 100

## 2021-09-06 MED ORDER — PHENYLEPHRINE HCL (PRESSORS) 10 MG/ML IV SOLN
INTRAVENOUS | Status: DC | PRN
  Administered 2021-09-06 (×3): 100 ug via INTRAVENOUS

## 2021-09-06 MED ORDER — CHLORHEXIDINE GLUCONATE 0.12 % MT SOLN
OROMUCOSAL | Status: AC
  Filled 2021-09-06: qty 15

## 2021-09-06 MED ORDER — CHLORHEXIDINE GLUCONATE 0.12 % MT SOLN
15.0000 mL | Freq: Once | OROMUCOSAL | Status: AC
  Administered 2021-09-06: 13:00:00 15 mL via OROMUCOSAL

## 2021-09-06 MED ORDER — KETOROLAC TROMETHAMINE 30 MG/ML IJ SOLN
INTRAMUSCULAR | Status: AC
  Filled 2021-09-06: qty 1

## 2021-09-06 MED ORDER — ACETAMINOPHEN 10 MG/ML IV SOLN
INTRAVENOUS | Status: DC | PRN
  Administered 2021-09-06: 1000 mg via INTRAVENOUS

## 2021-09-06 MED ORDER — ACETAMINOPHEN 10 MG/ML IV SOLN
INTRAVENOUS | Status: AC
  Filled 2021-09-06: qty 100

## 2021-09-06 SURGICAL SUPPLY — 44 items
APL PRP STRL LF DISP 70% ISPRP (MISCELLANEOUS) ×1
BAG COUNTER SPONGE SURGICOUNT (BAG) IMPLANT
BAG SPNG CNTER NS LX DISP (BAG)
BAG SURGICOUNT SPONGE COUNTING (BAG)
BLADE FULL RADIUS 3.5 (BLADE) ×3 IMPLANT
BLADE SHAVER 4.5X7 STR FR (MISCELLANEOUS) ×3 IMPLANT
BNDG ELASTIC 6X5.8 VLCR STR LF (GAUZE/BANDAGES/DRESSINGS) ×3 IMPLANT
BNDG ESMARK 6X12 TAN STRL LF (GAUZE/BANDAGES/DRESSINGS) ×3 IMPLANT
CHLORAPREP W/TINT 26 (MISCELLANEOUS) ×3 IMPLANT
COLLECTOR GRAFT TISSUE (SYSTAGENIX WOUND MANAGEMENT) ×3
CUFF TOURN SGL QUICK 24 (TOURNIQUET CUFF)
CUFF TOURN SGL QUICK 34 (TOURNIQUET CUFF)
CUFF TRNQT CYL 24X4X16.5-23 (TOURNIQUET CUFF) IMPLANT
CUFF TRNQT CYL 34X4.125X (TOURNIQUET CUFF) IMPLANT
DRAPE ARTHRO LIMB 89X125 STRL (DRAPES) ×3 IMPLANT
DRAPE IMP U-DRAPE 54X76 (DRAPES) ×3 IMPLANT
ELECT REM PT RETURN 9FT ADLT (ELECTROSURGICAL) ×3
ELECTRODE REM PT RTRN 9FT ADLT (ELECTROSURGICAL) ×1 IMPLANT
GAUZE SPONGE 4X4 12PLY STRL (GAUZE/BANDAGES/DRESSINGS) ×3 IMPLANT
GLOVE SURG ENC MOIS LTX SZ8 (GLOVE) ×6 IMPLANT
GLOVE SURG ENC TEXT LTX SZ7 (GLOVE) ×6 IMPLANT
GLOVE SURG UNDER LTX SZ8 (GLOVE) ×3 IMPLANT
GLOVE SURG UNDER POLY LF SZ7.5 (GLOVE) ×3 IMPLANT
GOWN STRL REUS W/ TWL LRG LVL3 (GOWN DISPOSABLE) ×1 IMPLANT
GOWN STRL REUS W/ TWL XL LVL3 (GOWN DISPOSABLE) ×2 IMPLANT
GOWN STRL REUS W/TWL LRG LVL3 (GOWN DISPOSABLE) ×3
GOWN STRL REUS W/TWL XL LVL3 (GOWN DISPOSABLE) ×6
IV LACTATED RINGER IRRG 3000ML (IV SOLUTION) ×3
IV LR IRRIG 3000ML ARTHROMATIC (IV SOLUTION) ×1 IMPLANT
KIT TURNOVER KIT A (KITS) ×3 IMPLANT
MANIFOLD NEPTUNE II (INSTRUMENTS) ×4 IMPLANT
NDL HYPO 21X1.5 SAFETY (NEEDLE) ×1 IMPLANT
NEEDLE HYPO 21X1.5 SAFETY (NEEDLE) ×3 IMPLANT
PACK ARTHROSCOPY KNEE (MISCELLANEOUS) ×3 IMPLANT
PENCIL ELECTRO HAND CTR (MISCELLANEOUS) ×3 IMPLANT
SPONGE T-LAP 18X18 ~~LOC~~+RFID (SPONGE) ×3 IMPLANT
SUT PROLENE 4 0 PS 2 18 (SUTURE) ×3 IMPLANT
SUT TICRON COATED BLUE 2 0 30 (SUTURE) IMPLANT
SYR 50ML LL SCALE MARK (SYRINGE) ×3 IMPLANT
TISSUE GRAFT COLLECTOR (SYSTAGENIX WOUND MANAGEMENT) IMPLANT
TUBING INFLOW SET DBFLO PUMP (TUBING) ×3 IMPLANT
WAND 30 DEG SABER W/CORD (SURGICAL WAND) IMPLANT
WAND WEREWOLF FLOW 90D (MISCELLANEOUS) ×3 IMPLANT
WATER STERILE IRR 500ML POUR (IV SOLUTION) ×3 IMPLANT

## 2021-09-06 NOTE — Discharge Instructions (Addendum)
Orthopedic discharge instructions: Keep dressing dry and intact.  May shower after dressing changed on post-op day #4 (Monday).  Cover sutures with Band-Aids after drying off. Apply ice frequently to knee. Take ibuprofen 600-800 mg TID with meals for 7-10 days, then as necessary. Take pain medication as prescribed or ES Tylenol when needed.  May weight-bear as tolerated - use crutches or walker as needed. Follow-up in 10-14 days or as scheduled.   AMBULATORY SURGERY  DISCHARGE INSTRUCTIONS   The drugs that you were given will stay in your system until tomorrow so for the next 24 hours you should not:  Drive an automobile Make any legal decisions Drink any alcoholic beverage   You may resume regular meals tomorrow.  Today it is better to start with liquids and gradually work up to solid foods.  You may eat anything you prefer, but it is better to start with liquids, then soup and crackers, and gradually work up to solid foods.   Please notify your doctor immediately if you have any unusual bleeding, trouble breathing, redness and pain at the surgery site, drainage, fever, or pain not relieved by medication.     Additional Instructions:

## 2021-09-06 NOTE — Transfer of Care (Signed)
Immediate Anesthesia Transfer of Care Note  Patient: Lee Gonzales  Procedure(s) Performed: RIGHT KNEE ARTHROSCOPY WITH DEBRIDEMENT, ABRASION CHONDROPLASTY OF THE LATERAL PATELLA FACET, LATERAL RELEASE, AND HARVEST/REINJECTION OF INFRAPATELLAR FAT PAD CELLS. (Right: Knee)  Patient Location: PACU  Anesthesia Type:General  Level of Consciousness: drowsy  Airway & Oxygen Therapy: Patient Spontanous Breathing and Patient connected to nasal cannula oxygen  Post-op Assessment: Report given to RN and Post -op Vital signs reviewed and stable  Post vital signs: Reviewed and stable  Last Vitals:  Vitals Value Taken Time  BP 157/99 09/06/21 1533  Temp 35.6 C 09/06/21 1531  Pulse 78 09/06/21 1534  Resp 14 09/06/21 1534  SpO2 99 % 09/06/21 1534  Vitals shown include unvalidated device data.  Last Pain:  Vitals:   09/06/21 1531  TempSrc:   PainSc: Asleep         Complications: No notable events documented.

## 2021-09-06 NOTE — Anesthesia Postprocedure Evaluation (Signed)
Anesthesia Post Note  Patient: Lee Gonzales  Procedure(s) Performed: RIGHT KNEE ARTHROSCOPY WITH DEBRIDEMENT, ABRASION CHONDROPLASTY OF THE LATERAL PATELLA FACET, LATERAL RELEASE, AND HARVEST/REINJECTION OF INFRAPATELLAR FAT PAD CELLS. (Right: Knee)  Patient location during evaluation: PACU Anesthesia Type: General Level of consciousness: awake and alert Pain management: pain level controlled Vital Signs Assessment: post-procedure vital signs reviewed and stable Respiratory status: spontaneous breathing, nonlabored ventilation, respiratory function stable and patient connected to nasal cannula oxygen Cardiovascular status: blood pressure returned to baseline and stable Postop Assessment: no apparent nausea or vomiting Anesthetic complications: no   No notable events documented.   Last Vitals:  Vitals:   09/06/21 1600 09/06/21 1623  BP: (!) 143/90 (!) 152/90  Pulse: 74 75  Resp: 13 14  Temp:  (!) 36.1 C  SpO2: 100% 100%    Last Pain:  Vitals:   09/06/21 1623  TempSrc: Temporal  PainSc: 0-No pain                 Lenard Simmer

## 2021-09-06 NOTE — H&P (Signed)
Subjective:  Chief complaint:  Right knee pain.  The patient is a 65 y.o. male who presents today to undergo a right knee arthroscopy with debridement, abrasion chondroplasty of the lateral patellar facet, possible lateral release, and harvesting/reinjection of infrapatellar fat cells.  Surgery is scheduled with Dr. Joice Lofts today, 09/06/21.  He denies any history of MI, stroke, asthma or COPD.  No personal history of DVT.  The patient denies any changes in his medical history since he was last evaluated at [facility] clinic orthopedics.  Patient Active Problem List   Diagnosis Date Noted   Renal stone 10/23/2016   Chronic insomnia 10/11/2016   Essential hypertension 10/02/2015   Gastroesophageal reflux disease without esophagitis 10/02/2015   Past Medical History:  Diagnosis Date   Arthritis    GERD (gastroesophageal reflux disease)    History of kidney stones    Hypertension    Insomnia disorder     Past Surgical History:  Procedure Laterality Date   APPENDECTOMY     COLONOSCOPY  10/08/2013   COLONOSCOPY WITH PROPOFOL N/A 04/07/2019   Procedure: COLONOSCOPY WITH PROPOFOL;  Surgeon: Toledo, Boykin Nearing, MD;  Location: [facility] ENDOSCOPY;  Service: Endoscopy;  Laterality: N/A;   deviated septum repair     EYE SURGERY     LASIK     WISDOM TOOTH EXTRACTION      Medications Prior to Admission  Medication Sig Dispense Refill Last Dose   azelastine (ASTELIN) 0.1 % nasal spray Place 1 spray into both nostrils daily as needed for rhinitis or allergies. Use in each nostril as directed   09/05/2021   fluticasone (FLONASE) 50 MCG/ACT nasal spray 1 spray daily.  12 09/05/2021   lisinopril (PRINIVIL,ZESTRIL) 40 MG tablet TAKE ONE TABLET BY MOUTH DAILY   09/05/2021   loratadine (CLARITIN) 10 MG tablet Take 10 mg by mouth daily.   Past Week   No Known Allergies  Social History   Tobacco Use   Smoking status: Never   Smokeless tobacco: Never  Substance Use Topics   Alcohol use: Yes    Comment:  rarely    History reviewed. No pertinent family history.   Review of Systems: As noted above. The patient denies any chest pain, shortness of breath, nausea, vomiting, diarrhea, constipation, belly pain, blood in his/her stool, or burning with urination.  Objective: Temp:  [97.2 F (36.2 C)] 97.2 F (36.2 C) (12/29 1227) Pulse Rate:  [67] 67 (12/29 1227) Resp:  [18] 18 (12/29 1227) BP: (154)/(110) 154/110 (12/29 1227) SpO2:  [99 %] 99 % (12/29 1227) Weight:  [83.5 kg] 83.5 kg (12/29 1227)  Physical Exam: General:  Alert, no acute distress Psychiatric:  Patient is competent for consent with normal mood and affect Cardiovascular:  RRR  Respiratory:  Clear to auscultation. No wheezing. Non-labored breathing GI:  Abdomen is soft and non-tender Skin:  No lesions in the area of chief complaint Neurologic:  Sensation intact distally Lymphatic:  No axillary or cervical lymphadenopathy  Orthopedic Exam:  Right knee exam: GAIT: normal and uses no assistive devices. ALIGNMENT: normal SKIN: unremarkable SWELLING: absent EFFUSION: absent WARMTH: no warmth TENDERNESS: Minimal tenderness over the lateral peripatellar region as well as over the lateral aspect of the proximal tib-fib joint ROM: full without pain McMURRAY'S: negative PATELLOFEMORAL: normal tracking with no peri-patellar tenderness and negative apprehension sign CREPITUS: Mild intermittent patellofemoral crepitance LACHMAN'S: negative PIVOT SHIFT: negative ANTERIOR DRAWER: negative POSTERIOR DRAWER: negative VARUS/VALGUS: stable  He is neurovascularly intact to the right lower extremity and  foot.   Imaging Review: A recent MRI scan of the right knee is available for review and has been reviewed by myself. By report, the study demonstrates evidence of moderate degenerative changes, primarily involving the lateral facet of the patellofemoral compartment. The medial lateral compartment spaces are well-maintained. There is  no evidence for medial or lateral meniscal tears, nor is there any evidence for ligamentous pathology. The proximal tibiofibular joint shows evidence of moderate degenerative changes with joint space narrowing and posterior osteophyte formation. There also is evidence of mild increased bone marrow signal to suggest active inflammation in this area.   Assessment: Primary osteoarthritis of the right knee.  Plan: 1.  Treatment options were discussed today with the patient. 2.  The patient is scheduled to undergo a right knee arthroscopy with debridement, abrasion chondroplasty of the lateral patella facet, possible lateral release and harvesting/reinjection of infrapatellar fat cells. 3.  Surgery will be performed by Dr. Joice Lofts on 09/06/2021. 4.  Risk and benefits of the surgery were discussed in detail.  This document will serve as a surgical history and physical. 5.  The patient will follow-up per standard postop protocol.  The procedure was discussed with the patient, as were the potential risks (including bleeding, infection, nerve and/or blood vessel injury, persistent or recurrent pain, failure of the repair, progression of arthritis, need for further surgery, blood clots, strokes, heart attacks and/or arhythmias, pneumonia, etc.) and benefits   J. Horris Latino, PA-C [facility]

## 2021-09-06 NOTE — Anesthesia Procedure Notes (Signed)
Procedure Name: LMA Insertion Date/Time: 09/06/2021 2:36 PM Performed by: Katherine Basset, CRNA Pre-anesthesia Checklist: Patient identified, Emergency Drugs available, Suction available and Patient being monitored Patient Re-evaluated:Patient Re-evaluated prior to induction Oxygen Delivery Method: Circle system utilized Preoxygenation: Pre-oxygenation with 100% oxygen Induction Type: IV induction Ventilation: Mask ventilation without difficulty LMA: LMA inserted LMA Size: 5.0 Tube type: Oral Number of attempts: 1 Placement Confirmation: positive ETCO2 and breath sounds checked- equal and bilateral Tube secured with: Tape Dental Injury: Teeth and Oropharynx as per pre-operative assessment

## 2021-09-06 NOTE — Op Note (Signed)
09/06/2021  3:47 PM  Patient:   Lee Gonzales  Pre-Op Diagnosis:   Primary osteoarthritis of right knee.  Postoperative diagnosis:   Same, primarily limited to the patellofemoral compartment.  Procedure:   Arthroscopic extensive synovectomy, abrasion chondroplasty of lateral patella facet, lateral release, and reinjection of harvested infrapatellar fat cells, right knee.  Surgeon:   Maryagnes Amos, MD  Anesthesia:   General LMA  Findings:   As above.  There was a area of grade 3 chondromalacial changes involving the lateral facet of the patella, measuring approximately 2 x 2 cm.  There also were grade 1-2 chondromalacial changes involving the femoral trochlea.  The remaining articular surfaces all were in satisfactory condition.  The medial and lateral meniscus both were in satisfactory condition, as were the anterior posterior cruciate ligaments.  Complications:   None  EBL:   3 cc.  Total fluids:   800 cc of crystalloid.  Tourniquet time:   None  Drains:   None  Closure:   4-0 Prolene interrupted sutures.  Brief clinical note:   The patient is a 65 year old male with a history of progressively worsening anterolateral and lateral sided right knee pain. His symptoms are aggravated by prolonged standing or ambulation, as well as ascending/descending stairs. His history and examination are consistent with patellofemoral syndrome secondary to patellofemoral arthrosis. These findings were confirmed by preoperative MRI scan. The patient presents at this time for arthroscopy, debridement, abrasion chondroplasty of the lateral patellar facet, and reinjection of harvested infrapatellar fat cells.  Procedure:   The patient was brought into the operating room and lain in the supine position. After adequate general laryngeal mask anesthesia was obtained, a timeout was performed to verify the appropriate side. The patient's right knee was injected sterilely using a solution of 30 cc of 1%  lidocaine and 30 cc of 0.5% Sensorcaine with epinephrine. The right lower extremity was prepped with ChloraPrep solution before being draped sterilely. Preoperative antibiotics were administered. The expected portal sites were injected with 0.5% Sensorcaine with epinephrine before the camera was placed in the anterolateral portal and instrumentation performed through the anteromedial portal.   The knee was sequentially examined beginning in the suprapatellar pouch, then progressing to the patellofemoral space, the medial gutter and compartment, the notch, and finally the lateral compartment and gutter. The findings were as described above. Abundant reactive synovial tissues anteriorly were debrided using the full-radius resector in order to improve visualization. Approximately 10 cc of fatty tissue from the infrapatellar fat pad was harvested using the full-radius resector and trapping it using the Arthrex graft net device. This tissue was collected on the back table then placed into a [place] syringe for reimplantation at the end of the case.    The medial and lateral menisci were carefully probed and found to be intact, as were the anterior and posterior cruciate ligaments. The area of grade III chondromalacia involving the lateral patellar facet was debrided back to stable margins using the full-radius resector. Patella tracking was assessed. The patella was noted to track somewhat laterally. This was felt to be due to an excessively tight lateral retinaculum. Therefore, the camera was repositioned in the anteromedial portal and the ArthroCare device introduced through the anterolateral portal. Under arthroscopic visualization, the lateral retinaculum was released from proximal to distal. Following release, the patella was noted to track more centrally in the femoral trochlea with symmetric spaces between the medial and lateral portions of the patella and their contiguous portions of the femoral  trochlea. The  instruments were removed from the joint after suctioning the excess fluid.    A short cannula was reintroduced into the joint. A #9 red rubber catheter was passed through this cannula into the joint. Through this red rubber catheter, the infrapatellar fat tissue that have been collected into the [place] syringe was injected into the joint. A small amount of sterile saline was then injected to be sure that all of the fat tissue was introduced into the joint before this cannula was removed as well.  The portal sites were closed using 4-0 Prolene interrupted sutures before a sterile bulky dressing was applied to the knee. The patient was then awakened, extubated, and returned to the recovery room in satisfactory condition after tolerating the procedure well.

## 2021-09-06 NOTE — Anesthesia Preprocedure Evaluation (Signed)
Anesthesia Evaluation  Patient identified by MRN, date of birth, ID band Patient awake    Reviewed: Allergy & Precautions, NPO status , Patient's Chart, lab work & pertinent test results  Airway Mallampati: II  TM Distance: >3 FB Neck ROM: Full    Dental no notable dental hx.    Pulmonary neg pulmonary ROS,    Pulmonary exam normal        Cardiovascular hypertension, Pt. on medications negative cardio ROS Normal cardiovascular exam     Neuro/Psych negative neurological ROS  negative psych ROS   GI/Hepatic Neg liver ROS, GERD  ,  Endo/Other  negative endocrine ROS  Renal/GU Renal disease  negative genitourinary   Musculoskeletal negative musculoskeletal ROS (+)   Abdominal   Peds negative pediatric ROS (+)  Hematology negative hematology ROS (+)   Anesthesia Other Findings Arthritis    GERD (gastroesophageal reflux disease)   History of kidney stones    Hypertension    Insomnia disorder       Reproductive/Obstetrics negative OB ROS                            Anesthesia Physical Anesthesia Plan  ASA: 2  Anesthesia Plan: General   Post-op Pain Management:    Induction: Intravenous  PONV Risk Score and Plan: 2 and Propofol infusion, Midazolam and Ondansetron  Airway Management Planned: LMA  Additional Equipment:   Intra-op Plan:   Post-operative Plan: Extubation in OR  Informed Consent: I have reviewed the patients History and Physical, chart, labs and discussed the procedure including the risks, benefits and alternatives for the proposed anesthesia with the patient or authorized representative who has indicated his/her understanding and acceptance.       Plan Discussed with: CRNA, Surgeon and Anesthesiologist  Anesthesia Plan Comments:         Anesthesia Quick Evaluation

## 2021-09-06 NOTE — H&P (Signed)
History of Present Illness: Lee Gonzales is a 65 y.o.male who is being referred by Dr. Edilia Bo for right knee pain. The symptoms began 10 months ago and developed without any specific injury, although the patient notes that he was skiing in [place] when he began to develop the symptoms. The patient does recall a remote history of injury while skiing about 40 years ago which was treated nonsurgically with long-leg casting. The patient has noted intermittent discomfort in his knee since then, but overall has been able to do what ever he wants to without restrictions. He saw Dr. Landry Mellow who diagnosed him with degenerative joint disease of the proximal right tib-fib joint. The joint was injected with temporary partial relief of his symptoms. However, his symptoms recurred so he was sent for an MRI scan and referred to me for further evaluation and treatment. He was last seen 3 months ago. At this visit, he felt that his symptoms were quite manageable and did not wish to pursue further intervention. However, his symptoms have begun to worsen over the past month or so, prompting him to make this return appointment.   He reports 2/10 pain in the knee on today's visit. The pain is located along the anterior and lateral aspect of the knee. The pain is described as aching, dull and throbbing. The symptoms are aggravated using stairs, at higher levels of activity, with sports including hiking and exercising. He also describes intermittent episodes where his knee will feel like it wants to buckle after he has been hiking or exercising for a prolonged period of time. He has no associated swelling and no deformity. He has tried anti-inflammatories, steroid injections and a home exercise program with limited benefit.  Current Outpatient Medications:  azelastine (ASTELIN) 137 mcg nasal spray SPRAY ONE SPRAY IN EACH NOSTRIL TWICE DAILY AS NEEDED FOR RHINITIS 30 mL 1   fluticasone propionate (FLONASE) 50 mcg/actuation  nasal spray SPRAY TWO SPRAYS IN EACH NOSTRIL ONCE DAILY AS NEEDED 16 g 11   lisinopriL (ZESTRIL) 40 MG tablet TAKE ONE TABLET BY MOUTH DAILY 90 tablet 1   traZODone (DESYREL) 50 MG tablet TAKE ONE TABLET BY MOUTH EVERY NIGHT AT BEDTIME AS NEEDED FOR SLEEP 90 tablet 3   UNABLE TO FIND OTC allergy   No Known Allergies  Asthma without status asthmaticus, unspecified (childhood)   Cluster headaches   GERD (gastroesophageal reflux disease)   Hypertension   Palpitations   Renal stone (CT abd/pelvis 12/15 showed no other stones or significant abnormalities)   Past Surgical History:   APPENDECTOMY   COLONOSCOPY 10/08/2013, 11/14/2005, 03/22/2008. PH adenomatous polyps. Repeat 5 years, Dr. Mechele Collin. Recall ltr mailed   COLONOSCOPY 04/07/2019 (Intenal Hemorrhoids/PHx CP/Repeat 29yrs/TKT)   CORNEAL EYE SURGERY Bilateral 2010 and 03/11  LASIK- Dr. Selena Batten. Touch up LASIK OD 03/11   Deviated Septum repair   Family History:   Coronary Artery Disease - Father (CABG x 2)   Myocardial Infarction (Heart attack) Father (1st age 25)   Coronary Artery Disease (Blocked arteries around heart) Paternal Aunt   Coronary Artery Disease (Blocked arteries around heart) Paternal Aunt   Coronary Artery Disease (Blocked arteries around heart) Paternal Aunt   Social History:   Socioeconomic History:   Marital status: Married  Tobacco Use   Smoking status: Never   Smokeless tobacco: Never  Vaping Use   Vaping Use: Never used  Substance and Sexual Activity   Alcohol use: Yes  Comment: socially   Drug use: No   Review  of Systems:  A comprehensive 14 point ROS was performed, reviewed, and the pertinent orthopaedic findings are documented in the HPI.  Physical Exam: Vitals:  07/23/21 1007  BP: (!) 142/98  Weight: 84.9 kg (187 lb 3.2 oz)  Height: 177.8 cm (5\' 10" )  PainSc: 2  PainLoc: Knee   General/Constitutional: The patient appears to be well-nourished, well-developed, and in no acute  distress. Neuro/Psych: Normal mood and affect, oriented to person, place and time. Eyes: Non-icteric. Pupils are equal, round, and reactive to light, and exhibit synchronous movement. Lymphatic: No palpable adenopathy. Respiratory: Lungs clear to auscultation, Normal chest excursion, No wheezes and Non-labored breathing Cardiovascular: Regular rate and rhythm. No murmurs. and No edema, swelling or tenderness, except as noted in detailed exam. Vascular: No edema, swelling or tenderness, except as noted in detailed exam. Integumentary: No impressive skin lesions present, except as noted in detailed exam. Musculoskeletal: Unremarkable, except as noted in detailed exam.  Right knee exam: GAIT: normal and uses no assistive devices. ALIGNMENT: normal SKIN: unremarkable SWELLING: absent EFFUSION: absent WARMTH: no warmth TENDERNESS: Minimal tenderness over the lateral peripatellar region as well as over the lateral aspect of the proximal tib-fib joint ROM: full without pain McMURRAY'S: negative PATELLOFEMORAL: normal tracking with no peri-patellar tenderness and negative apprehension sign CREPITUS: Mild intermittent patellofemoral crepitance LACHMAN'S: negative PIVOT SHIFT: negative ANTERIOR DRAWER: negative POSTERIOR DRAWER: negative VARUS/VALGUS: stable  He is neurovascularly intact to the right lower extremity and foot.  Knee Imaging: Standing AP and lateral weightbearing views of the right knee, as well as a sunrise view, are obtained. These films demonstrate moderate degenerative changes, primarily involving the proximal tib-fib joint as manifest by osteophyte formation. Lesser degenerative changes of the patellofemoral compartment also are noted as manifest by mild joint space narrowing and subchondral cystic changes involving the lateral patellar facet. The medial and lateral compartments appear to be well-maintained. Overall alignment is neutral. No fractures, lytic lesions, or  abnormal calcifications are noted.  Knee Imaging, external: Right knee: A recent MRI scan of the right knee is available for review and has been reviewed by myself. By report, the study demonstrates evidence of moderate degenerative changes, primarily involving the lateral facet of the patellofemoral compartment. The medial lateral compartment spaces are well-maintained. There is no evidence for medial or lateral meniscal tears, nor is there any evidence for ligamentous pathology. The proximal tibiofibular joint shows evidence of moderate degenerative changes with joint space narrowing and posterior osteophyte formation. There also is evidence of mild increased bone marrow signal to suggest active inflammation in this area. Both the films and report were reviewed by myself and discussed with the patient.  Assessment:   Primary osteoarthritis of right knee Yes   Plan: The treatment options were discussed with the patient. In addition, patient educational materials were provided regarding the diagnosis and treatment options. The patient is quite frustrated by his symptoms and functional limitations, and would like to consider more aggressive treatment options. Based on his symptomatic picture, I feel that his patellofemoral symptoms are more pronounced with ascending/descending stairs or slopes, as well as with prolonged walks on even surfaces. The more lateral-sided knee symptoms that radiate down the lateral aspect of his lower leg are more likely related to his proximal tibiofibular joint issues. The symptoms appear to be aggravated by prolonged hiking or walking on uneven terrain, or with more aggressive sports such as skiing. The patient feels that his former symptoms are more problematic at this time. More so, he does  not wish to consider either a fusion or a resection of the proximal tib-fib joint.   Therefore, I have recommended a surgical procedure, specifically a right knee arthroscopy with  debridement, abrasion chondroplasty of the lateral patellar facet, possible lateral release, and harvesting/reinjection of infrapatellar fat cells. The procedure was discussed with the patient, as were the potential risks (including bleeding, infection, nerve and/or blood vessel injury, persistent or recurrent pain, failure of the repair, progression of arthritis, need for further surgery, blood clots, strokes, heart attacks and/or arhythmias, pneumonia, etc.) and benefits. The patient states his understanding and wishes to proceed. All of the patient's questions and concerns were answered. He can call any time with further concerns. He will follow up post-surgery, routine.   H&P reviewed and patient re-examined. No changes.

## 2021-09-07 ENCOUNTER — Encounter: Payer: Self-pay | Admitting: Surgery

## 2021-10-13 IMAGING — MR MR KNEE*R* W/O CM
7 series · 40 of 40 positions shown · non-contrast
Comparison: None.

CLINICAL DATA: Chronic lateral knee pain.  No injury.

EXAM:
MRI OF THE RIGHT KNEE WITHOUT CONTRAST
TECHNIQUE: Multiplanar, multisequence MR imaging of the knee was performed. No
intravenous contrast was administered.

[Series 8: T2 fat-sat · axial · right · 4.0mm · 0.50mm/px · z∈[-80,+44]mm · 6 of 26 slices shown (1 of 3)]
[im 1/26]
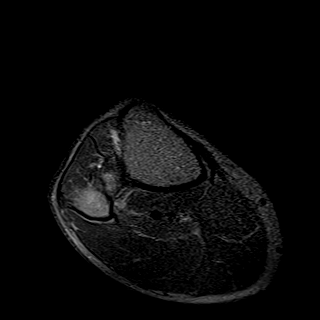
[im 6/26]
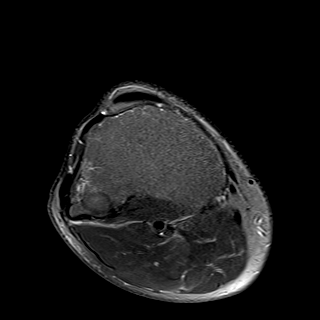
[im 11/26]
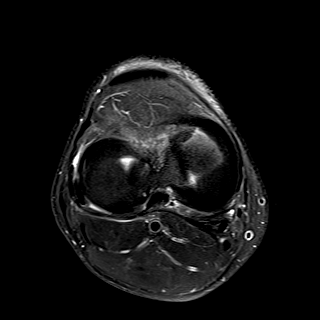
[im 16/26]
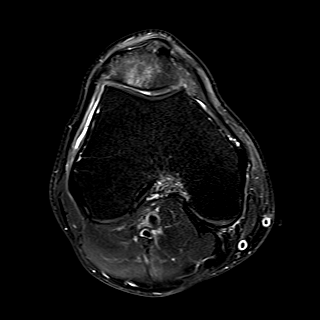
[im 21/26]
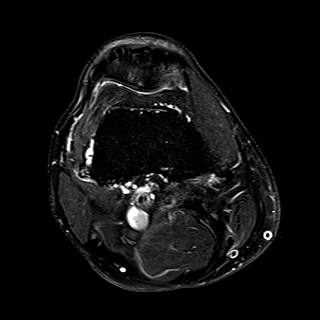
[im 26/26]
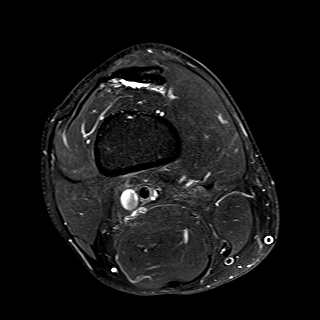

[Series 9: T1 · coronal · right · 4.0mm · 0.47mm/px · 6 of 32 slices shown]
[im 1/32]
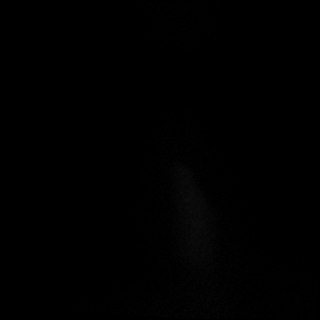
[im 7/32]
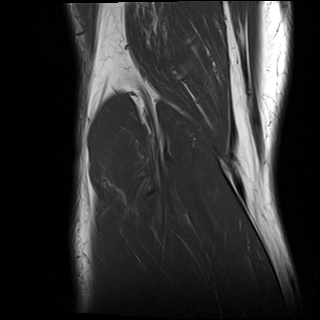
[im 13/32]
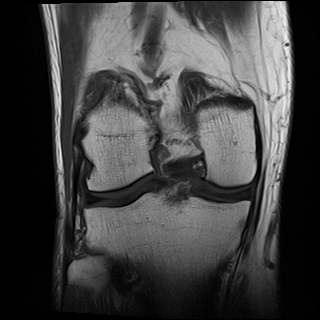
[im 19/32]
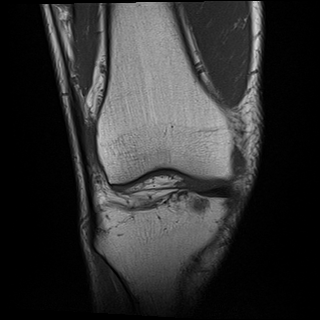
[im 25/32]
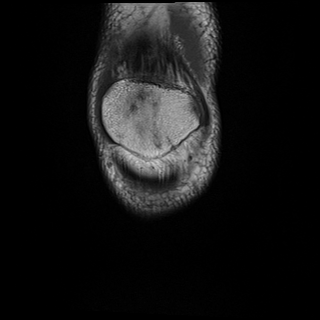
[im 32/32]
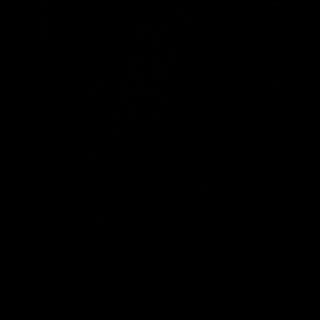

[Series 10: T2 fat-sat · coronal · right · 4.0mm · 0.47mm/px · 6 of 32 slices shown (2 of 3)]
[im 1/32]
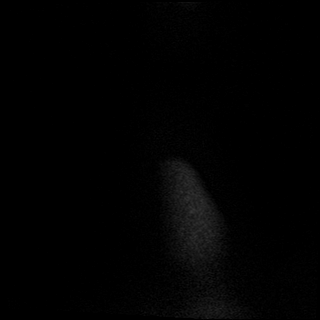
[im 7/32]
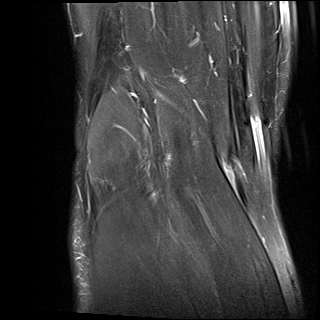
[im 13/32]
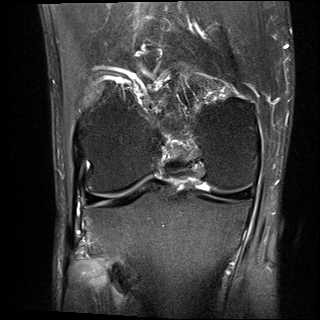
[im 19/32]
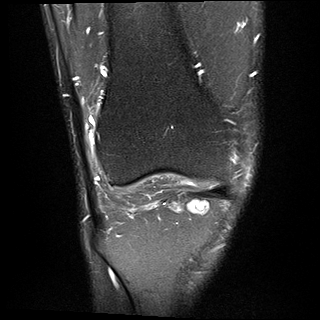
[im 25/32]
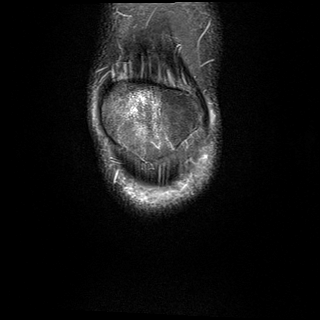
[im 32/32]
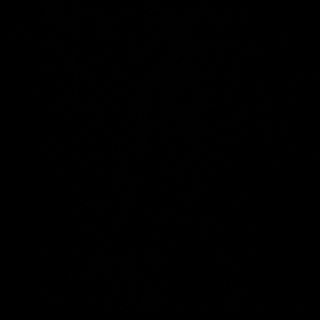

[Series 11: PD fat-sat · coronal · right · 4.0mm · 0.59mm/px · 6 of 30 slices shown (1 of 2)]
[im 1/30]
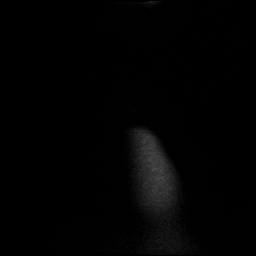
[im 6/30]
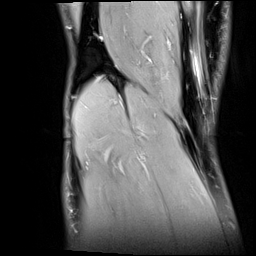
[im 12/30]
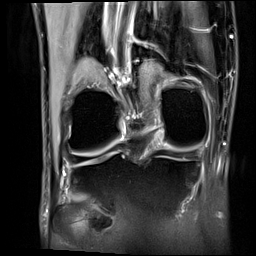
[im 18/30]
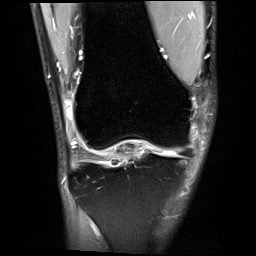
[im 24/30]
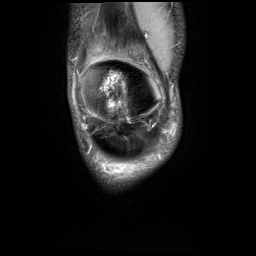
[im 30/30]
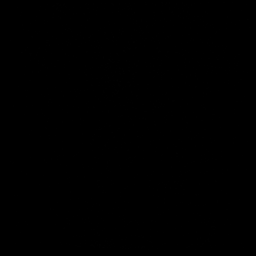

[Series 12: PD fat-sat · sagittal · right · 3.0mm · 0.47mm/px · 7 of 36 slices shown (2 of 2)]
[im 1/36]
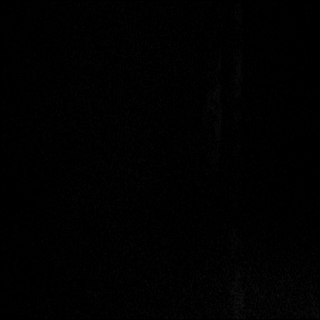
[im 6/36]
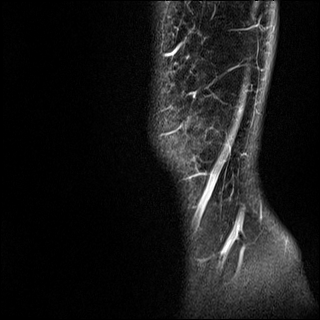
[im 12/36]
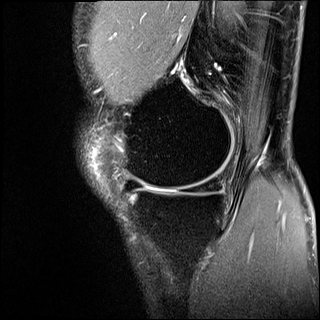
[im 18/36]
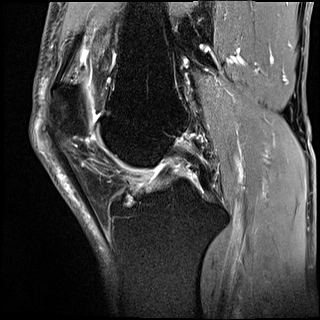
[im 24/36]
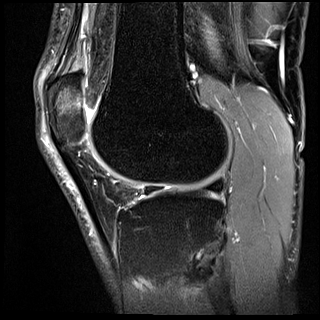
[im 30/36]
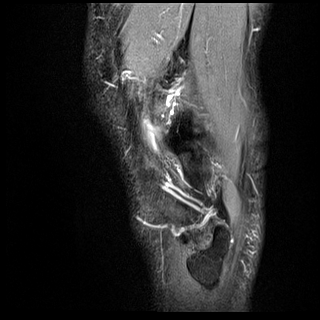
[im 36/36]
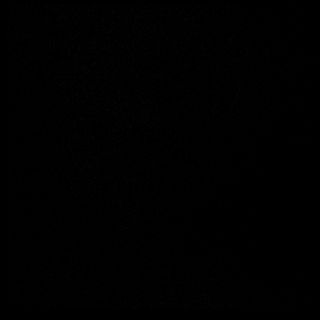

[Series 13: T2 fat-sat · sagittal · right · 3.0mm · 0.47mm/px · 7 of 35 slices shown (3 of 3)]
[im 1/35]
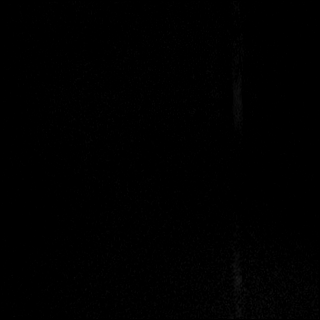
[im 6/35]
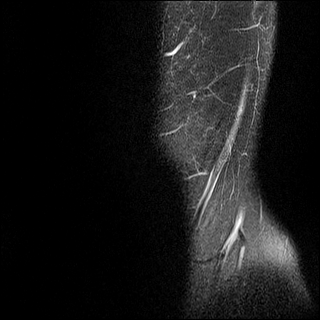
[im 12/35]
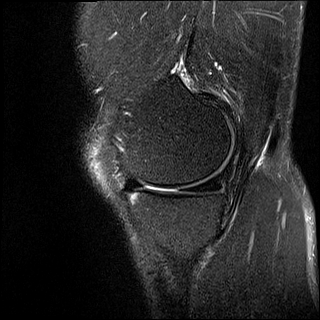
[im 18/35]
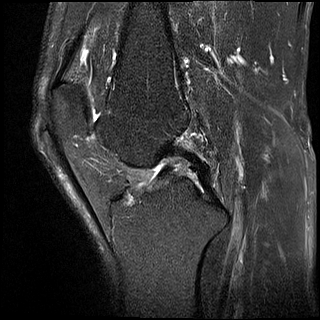
[im 23/35]
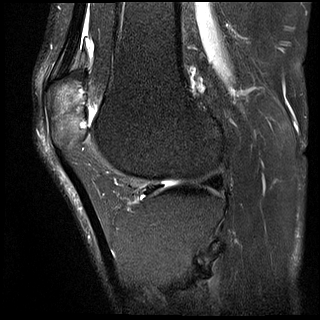
[im 29/35]
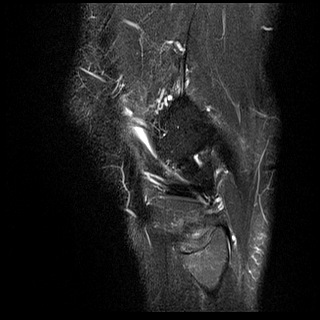
[im 35/35]
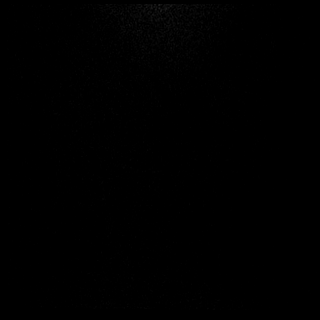

[Series 14: PD · coronal · right · 2.0mm · 0.47mm/px · 2 of 10 slices shown]
[im 1/10]
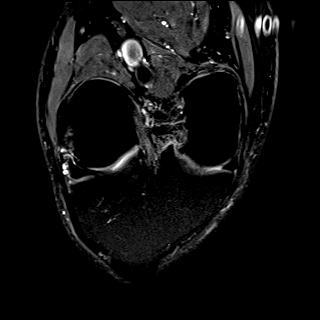
[im 10/10]
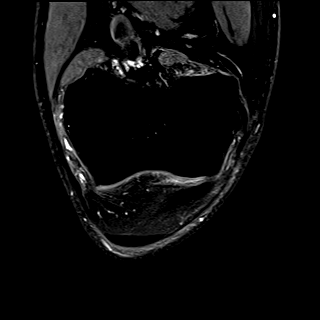

[40 of 40 positions shown; findings below may reference images not displayed]

FINDINGS: MENISCI

Medial meniscus:  Intact.

Lateral meniscus:  Intact.

LIGAMENTS

Cruciates:  Intact ACL and PCL.

Collaterals: Medial collateral ligament is intact. Lateral
collateral ligament complex is intact.

CARTILAGE

Patellofemoral: High-grade partial and full-thickness cartilage loss
over the lateral patellar facet with prominent subchondral marrow
edema and cystic change. Partial-thickness cartilage loss over the
medial trochlea.

Medial:  No chondral defect.

Lateral:  No chondral defect.

Joint:  No significant joint effusion.  Normal Hoffa's fat.

Popliteal Fossa:  No Baker cyst. Intact popliteus tendon.

Extensor Mechanism: Intact quadriceps tendon and patellar tendon.
Prominent inferior patellar enthesophyte. Intact medial and lateral
patellar retinaculum. Intact MPFL.

Bones: There is bulky marginal spurring and apparent pseudoarthrosis
along the medial aspect of the proximal tibiofibular joint (series
9, image 12) with some associated marrow edema in the fibular head.
No acute fracture or dislocation. No suspicious bone lesion. Small
intraosseous ganglion cyst in the anterior medial tibial plateau.

Other: None.
IMPRESSION: 1. No evidence of internal derangement.
2. Bulky marginal spurring and apparent pseudoarthrosis along the
medial aspect of the proximal tibiofibular joint. Findings are
likely degenerative and/or posttraumatic, but could be symptomatic
given mild marrow edema in the adjacent fibular head. CT may help
better delineate bony detail in this area.
3. Mild to moderate patellofemoral compartment osteoarthritis.

## 2022-12-31 ENCOUNTER — Other Ambulatory Visit (HOSPITAL_COMMUNITY): Payer: Self-pay | Admitting: Internal Medicine

## 2022-12-31 ENCOUNTER — Encounter: Payer: Self-pay | Admitting: Internal Medicine

## 2022-12-31 DIAGNOSIS — R079 Chest pain, unspecified: Secondary | ICD-10-CM

## 2023-01-16 ENCOUNTER — Other Ambulatory Visit (HOSPITAL_COMMUNITY): Payer: Self-pay | Admitting: *Deleted

## 2023-01-16 ENCOUNTER — Encounter (HOSPITAL_COMMUNITY): Payer: Self-pay

## 2023-01-16 MED ORDER — METOPROLOL TARTRATE 50 MG PO TABS: ORAL_TABLET | ORAL | 0 refills | Status: AC

## 2023-01-17 ENCOUNTER — Telehealth (HOSPITAL_COMMUNITY): Payer: Self-pay | Admitting: *Deleted

## 2023-01-17 NOTE — Telephone Encounter (Signed)
Attempted to call patient regarding upcoming cardiac CT appointment. °Left message on voicemail with name and callback number ° °Ritika Hellickson RN Navigator Cardiac Imaging °[facility] °[phone] Office °[phone] Cell ° °

## 2023-01-20 ENCOUNTER — Ambulatory Visit
Admission: RE | Admit: 2023-01-20 | Discharge: 2023-01-20 | Disposition: A | Discharge status: Home / Self Care | Payer: Medicare Other | Source: Ambulatory Visit | Attending: Internal Medicine | Admitting: Internal Medicine

## 2023-01-20 DIAGNOSIS — N62 Hypertrophy of breast: Secondary | ICD-10-CM | POA: Diagnosis not present

## 2023-01-20 DIAGNOSIS — I2510 Atherosclerotic heart disease of native coronary artery without angina pectoris: Secondary | ICD-10-CM | POA: Insufficient documentation

## 2023-01-20 DIAGNOSIS — J9811 Atelectasis: Secondary | ICD-10-CM | POA: Diagnosis not present

## 2023-01-20 DIAGNOSIS — R079 Chest pain, unspecified: Secondary | ICD-10-CM | POA: Diagnosis present

## 2023-01-20 MED ORDER — IOHEXOL 350 MG/ML SOLN
100.0000 mL | Freq: Once | INTRAVENOUS | Status: AC | PRN
  Administered 2023-01-20: 80 mL via INTRAVENOUS

## 2023-01-20 MED ORDER — NITROGLYCERIN 0.4 MG SL SUBL
0.8000 mg | SUBLINGUAL_TABLET | Freq: Once | SUBLINGUAL | Status: AC
  Administered 2023-01-20: 0.8 mg via SUBLINGUAL
  Filled 2023-01-20: qty 25

## 2023-01-20 NOTE — Progress Notes (Signed)
Patient tolerated procedure well. Ambulate w/o difficulty. Denies any lightheadedness or being dizzy. Pt denies any pain at this time. Sitting in chair, pt is encouraged to drink additional water throughout the day and reason explained to patient. Patient verbalized understanding and all questions answered. ABC intact. No further needs at this time. Discharge from procedure area w/o issues.  

## 2023-01-27 ENCOUNTER — Ambulatory Visit: Payer: BLUE CROSS/BLUE SHIELD

## 2024-09-27 ENCOUNTER — Encounter: Payer: Self-pay | Admitting: Internal Medicine

## 2024-10-05 ENCOUNTER — Encounter: Payer: Self-pay | Admitting: Internal Medicine

## 2024-10-05 ENCOUNTER — Ambulatory Visit
Admission: RE | Admit: 2024-10-05 | Discharge: 2024-10-05 | Disposition: A | Attending: Internal Medicine | Admitting: Internal Medicine

## 2024-10-05 ENCOUNTER — Ambulatory Visit: Admitting: Anesthesiology

## 2024-10-05 ENCOUNTER — Other Ambulatory Visit: Payer: Self-pay

## 2024-10-05 ENCOUNTER — Encounter
Admission: RE | Disposition: A | Discharge status: Home / Self Care | Payer: Self-pay | Source: Home / Self Care | Attending: Internal Medicine

## 2024-10-05 DIAGNOSIS — K5730 Diverticulosis of large intestine without perforation or abscess without bleeding: Secondary | ICD-10-CM | POA: Insufficient documentation

## 2024-10-05 DIAGNOSIS — Z1211 Encounter for screening for malignant neoplasm of colon: Secondary | ICD-10-CM | POA: Insufficient documentation

## 2024-10-05 DIAGNOSIS — I10 Essential (primary) hypertension: Secondary | ICD-10-CM | POA: Insufficient documentation

## 2024-10-05 DIAGNOSIS — Z860101 Personal history of adenomatous and serrated colon polyps: Secondary | ICD-10-CM | POA: Insufficient documentation

## 2024-10-05 MED ORDER — PROPOFOL 10 MG/ML IV BOLUS
INTRAVENOUS | Status: DC | PRN
  Administered 2024-10-05: 30 mg via INTRAVENOUS
  Administered 2024-10-05: 50 mg via INTRAVENOUS

## 2024-10-05 MED ORDER — PROPOFOL 500 MG/50ML IV EMUL
INTRAVENOUS | Status: DC | PRN
  Administered 2024-10-05: 75 ug/kg/min via INTRAVENOUS

## 2024-10-05 MED ORDER — LIDOCAINE HCL (PF) 2 % IJ SOLN
INTRAMUSCULAR | Status: AC
  Filled 2024-10-05: qty 5

## 2024-10-05 MED ORDER — DEXMEDETOMIDINE HCL IN NACL 80 MCG/20ML IV SOLN
INTRAVENOUS | Status: DC | PRN
  Administered 2024-10-05: 12 ug via INTRAVENOUS
  Administered 2024-10-05: 8 ug via INTRAVENOUS

## 2024-10-05 MED ORDER — SODIUM CHLORIDE 0.9 % IV SOLN: INTRAVENOUS | Status: DC

## 2024-10-05 MED ORDER — LIDOCAINE HCL (CARDIAC) PF 100 MG/5ML IV SOSY
PREFILLED_SYRINGE | INTRAVENOUS | Status: DC | PRN
  Administered 2024-10-05: 60 mg via INTRAVENOUS

## 2024-10-05 NOTE — Interval H&P Note (Signed)
 History and Physical Interval Note:  10/05/2024 9:35 AM  Lee Gonzales  has presented today for surgery, with the diagnosis of History of adenomatous polyp of colon (Z86.0101).  The various methods of treatment have been discussed with the patient and family. After consideration of risks, benefits and other options for treatment, the patient has consented to  Procedures: COLONOSCOPY (N/A) as a surgical intervention.  The patient's history has been reviewed, patient examined, no change in status, stable for surgery.  I have reviewed the patient's chart and labs.  Questions were answered to the patient's satisfaction.     [place], Lee Gonzales

## 2024-10-05 NOTE — Anesthesia Preprocedure Evaluation (Signed)
"                                    Anesthesia Evaluation  Patient identified by MRN, date of birth, ID band Patient awake    Reviewed: Allergy & Precautions, NPO status , Patient's Chart, lab work & pertinent test results  History of Anesthesia Complications Negative for: history of anesthetic complications  Airway Mallampati: III  TM Distance: >3 FB Neck ROM: Full    Dental  (+) Caps, Dental Advidsory Given, Implants, Teeth Intact   Pulmonary neg pulmonary ROS   Pulmonary exam normal        Cardiovascular hypertension, Pt. on medications (-) angina (-) Past MI, (-) Cardiac Stents and (-) CABG Normal cardiovascular exam(-) dysrhythmias (-) Valvular Problems/Murmurs     Neuro/Psych negative neurological ROS  negative psych ROS   GI/Hepatic Neg liver ROS,GERD  ,,  Endo/Other  negative endocrine ROS    Renal/GU Renal disease  negative genitourinary   Musculoskeletal negative musculoskeletal ROS (+)    Abdominal   Peds negative pediatric ROS (+)  Hematology negative hematology ROS (+)   Anesthesia Other Findings Arthritis    GERD (gastroesophageal reflux disease)   History of kidney stones    Hypertension    Insomnia disorder       Reproductive/Obstetrics negative OB ROS                              Anesthesia Physical Anesthesia Plan  ASA: 2  Anesthesia Plan: General   Post-op Pain Management:    Induction: Intravenous  PONV Risk Score and Plan: 2 and Propofol  infusion and TIVA  Airway Management Planned: Natural Airway and Nasal Cannula  Additional Equipment:   Intra-op Plan:   Post-operative Plan:   Informed Consent: I have reviewed the patients History and Physical, chart, labs and discussed the procedure including the risks, benefits and alternatives for the proposed anesthesia with the patient or authorized representative who has indicated his/her understanding and acceptance.       Plan  Discussed with: CRNA, Surgeon and Anesthesiologist  Anesthesia Plan Comments:          Anesthesia Quick Evaluation  "

## 2024-10-05 NOTE — Op Note (Signed)
 [facility] Gastroenterology Patient Name: Lee Gonzales Procedure Date: 10/05/2024 9:37 AM MRN: [id number] Account #: [id number] Date of Birth: 1956-08-13 Admit Type: Outpatient Age: 69 Room: [facility] ENDO ROOM 1 Gender: Male Note Status: Finalized Instrument Name: Colon Scope [id number] Procedure:             Colonoscopy Indications:           High risk colon cancer surveillance: Personal history                         of non-advanced adenoma Providers:             Shay Jhaveri K. Aundria MD, MD Referring MD:          Ophelia Sage, MD (Referring MD) Medicines:             Propofol  per Anesthesia Complications:         No immediate complications. Estimated blood loss: None. Procedure:             Pre-Anesthesia Assessment:                        - The risks and benefits of the procedure and the                         sedation options and risks were discussed with the                         patient. All questions were answered and informed                         consent was obtained.                        - Patient identification and proposed procedure were                         verified prior to the procedure by the nurse. The                         procedure was verified in the procedure room.                        - ASA Grade Assessment: III - A patient with severe                         systemic disease.                        - After reviewing the risks and benefits, the patient                         was deemed in satisfactory condition to undergo the                         procedure.                        After obtaining informed consent, the colonoscope was  passed under direct vision. Throughout the procedure,                         the patient's blood pressure, pulse, and oxygen                         saturations were monitored continuously. The                         Colonoscope was introduced through the anus and                          advanced to the the cecum, identified by appendiceal                         orifice and ileocecal valve. The colonoscopy was                         performed without difficulty. The patient tolerated                         the procedure well. The quality of the bowel                         preparation was adequate. The ileocecal valve,                         appendiceal orifice, and rectum were photographed. Findings:      The perianal and digital rectal examinations were normal. Pertinent       negatives include normal sphincter tone and no palpable rectal lesions.      Many medium-mouthed and small-mouthed diverticula were found in the       sigmoid colon. There was no evidence of diverticular bleeding. Estimated       blood loss: none.      The exam was otherwise without abnormality on direct and retroflexion       views. Impression:            - Diverticulosis in the sigmoid colon. There was no                         evidence of diverticular bleeding.                        - The examination was otherwise normal on direct and                         retroflexion views.                        - No specimens collected. Recommendation:        - Patient has a contact number available for                         emergencies. The signs and symptoms of potential                         delayed complications were discussed with the patient.  Return to normal activities tomorrow. Written                         discharge instructions were provided to the patient.                        - High fiber diet.                        - Continue present medications.                        - You do NOT require further colon cancer screening                         measures (Annual stool testing (i.e. hemoccult, FIT,                         cologuard), sigmoidoscopy, colonoscopy or CT                         colonography). You should share this recommendation                          with your Primary Care provider.                        - Return to GI office PRN.                        - The findings and recommendations were discussed with                         the patient. Procedure Code(s):     --- Professional ---                        H9894, Colorectal cancer screening; colonoscopy on                         individual at high risk Diagnosis Code(s):     --- Professional ---                        K57.30, Diverticulosis of large intestine without                         perforation or abscess without bleeding                        Z86.010, Personal history of colonic polyps CPT copyright 2022 [employer]. All rights reserved. The codes documented in this report are preliminary and upon coder review may  be revised to meet current compliance requirements. Ladell MARLA Boss MD, MD 10/05/2024 10:03:27 AM This report has been signed electronically. Number of Addenda: 0 Note Initiated On: 10/05/2024 9:37 AM Scope Withdrawal Time: 0 hours 7 minutes 0 seconds  Total Procedure Duration: 0 hours 12 minutes 2 seconds  Estimated Blood Loss:  Estimated blood loss: none.      [facility]

## 2024-10-05 NOTE — H&P (Signed)
 Outpatient short stay form Pre-procedure 10/05/2024 9:34 AM Lee Gonzales, M.D.  Primary Physician: Lee Gonzales, M.D.  Reason for visit:  History of colon polyps (adenomatous)  History of present illness:    [x]  Yes, Last colonoscopy 04/07/2019 with Dr. Aundria Prior colonoscopies, 2015, 2009, 2007   Patient denies change in bowel habits, rectal bleeding, weight loss or abdominal pain.   Current Medications[1]  Medications Prior to Admission  Medication Sig Dispense Refill Last Dose/Taking   lisinopril (PRINIVIL,ZESTRIL) 40 MG tablet TAKE ONE TABLET BY MOUTH DAILY   10/05/2024 Morning   metoprolol  tartrate (LOPRESSOR ) 50 MG tablet Take tablet (50mg ) TWO hours prior to your cardiac CT scan. 1 tablet 0 10/04/2024 Bedtime   azelastine (ASTELIN) 0.1 % nasal spray Place 1 spray into both nostrils daily as needed for rhinitis or allergies. Use in each nostril as directed      fluticasone (FLONASE) 50 MCG/ACT nasal spray 1 spray daily.  12    HYDROcodone -acetaminophen  (NORCO) 5-325 MG tablet Take 1-2 tablets by mouth every 6 (six) hours as needed for moderate pain or severe pain. MAXIMUM TOTAL ACETAMINOPHEN  DOSE IS 4000 MG PER DAY 30 tablet 0    loratadine (CLARITIN) 10 MG tablet Take 10 mg by mouth daily.        Allergies[2]   Past Medical History:  Diagnosis Date   Arthritis    GERD (gastroesophageal reflux disease)    History of kidney stones    Hypertension    Insomnia disorder     Review of systems:  Otherwise negative.    Physical Exam  Gen: Alert, oriented. Appears stated age.  HEENT: [place]/AT. PERRLA. Lungs: CTA, no wheezes. CV: RR nl S1, S2. Abd: soft, benign, no masses. BS+ Ext: No edema. Pulses 2+    Planned procedures: Proceed with colonoscopy. The patient understands the nature of the planned procedure, indications, risks, alternatives and potential complications including but not limited to bleeding, infection, perforation, damage to internal organs and  possible oversedation/side effects from anesthesia. The patient agrees and gives consent to proceed.  Please refer to procedure notes for findings, recommendations and patient disposition/instructions.     Lee Gonzales K. Gonzales, M.D. Gastroenterology 10/05/2024  9:34 AM          [1]  Current Facility-Administered Medications:    0.9 %  sodium chloride  infusion, , Intravenous, Continuous, Tymar Polyak K, MD [2] No Known Allergies

## 2024-10-05 NOTE — Transfer of Care (Signed)
 Immediate Anesthesia Transfer of Care Note  Patient: Lee Gonzales  Procedure(s) Performed: COLONOSCOPY  Patient Location: PACU  Anesthesia Type:General  Level of Consciousness: sedated  Airway & Oxygen Therapy: Patient Spontanous Breathing  Post-op Assessment: Report given to RN and Post -op Vital signs reviewed and stable  Post vital signs: Reviewed and stable  Last Vitals:  Vitals Value Taken Time  BP 103/76 10/05/24 10:04  Temp    Pulse 67 10/05/24 10:04  Resp 16 10/05/24 10:04  SpO2 97 % 10/05/24 10:04  Vitals shown include unfiled device data.  Last Pain:  Vitals:   10/05/24 0922  TempSrc: Temporal  PainSc: 0-No pain         Complications: No notable events documented.
